# Patient Record
Sex: Female | Born: 1940 | Race: White | Hispanic: No | Smoking: Former smoker
Health system: Southern US, Community
[De-identification: ages and names within clinical notes are randomized; demographics above are authoritative.]

## PROBLEM LIST (undated history)

## (undated) DIAGNOSIS — K5520 Angiodysplasia of colon without hemorrhage: Secondary | ICD-10-CM

## (undated) DIAGNOSIS — N3281 Overactive bladder: Secondary | ICD-10-CM

## (undated) DIAGNOSIS — L719 Rosacea, unspecified: Secondary | ICD-10-CM

## (undated) DIAGNOSIS — M810 Age-related osteoporosis without current pathological fracture: Secondary | ICD-10-CM

## (undated) DIAGNOSIS — E785 Hyperlipidemia, unspecified: Secondary | ICD-10-CM

## (undated) DIAGNOSIS — I10 Essential (primary) hypertension: Secondary | ICD-10-CM

## (undated) DIAGNOSIS — S129XXA Fracture of neck, unspecified, initial encounter: Secondary | ICD-10-CM

## (undated) DIAGNOSIS — F1010 Alcohol abuse, uncomplicated: Secondary | ICD-10-CM

## (undated) DIAGNOSIS — K5790 Diverticulosis of intestine, part unspecified, without perforation or abscess without bleeding: Secondary | ICD-10-CM

## (undated) DIAGNOSIS — Z86010 Personal history of colonic polyps: Secondary | ICD-10-CM

## (undated) HISTORY — PX: APPENDECTOMY: SHX54

## (undated) HISTORY — PX: OTHER SURGICAL HISTORY: SHX169

## (undated) HISTORY — DX: Angiodysplasia of colon without hemorrhage: K55.20

## (undated) HISTORY — DX: Essential (primary) hypertension: I10

## (undated) HISTORY — DX: Rosacea, unspecified: L71.9

## (undated) HISTORY — DX: Personal history of colonic polyps: Z86.010

## (undated) HISTORY — DX: Age-related osteoporosis without current pathological fracture: M81.0

## (undated) HISTORY — DX: Diverticulosis of intestine, part unspecified, without perforation or abscess without bleeding: K57.90

## (undated) HISTORY — DX: Alcohol abuse, uncomplicated: F10.10

## (undated) HISTORY — DX: Overactive bladder: N32.81

## (undated) HISTORY — DX: Fracture of neck, unspecified, initial encounter: S12.9XXA

## (undated) HISTORY — DX: Hyperlipidemia, unspecified: E78.5

---
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
## 1991-02-19 DIAGNOSIS — S129XXA Fracture of neck, unspecified, initial encounter: Secondary | ICD-10-CM

## 1991-02-19 HISTORY — DX: Fracture of neck, unspecified, initial encounter: S12.9XXA

## 2006-07-17 DIAGNOSIS — K5790 Diverticulosis of intestine, part unspecified, without perforation or abscess without bleeding: Secondary | ICD-10-CM

## 2006-07-17 HISTORY — DX: Diverticulosis of intestine, part unspecified, without perforation or abscess without bleeding: K57.90

## 2006-07-17 HISTORY — PX: COLONOSCOPY: SHX174

## 2007-06-27 ENCOUNTER — Encounter: Admission: RE | Admit: 2007-06-27 | Discharge: 2007-06-27 | Payer: Self-pay | Admitting: Family Medicine

## 2009-01-16 ENCOUNTER — Encounter: Admission: RE | Admit: 2009-01-16 | Discharge: 2009-01-16 | Payer: Self-pay | Admitting: Orthopedic Surgery

## 2010-03-11 ENCOUNTER — Encounter: Payer: Self-pay | Admitting: Orthopedic Surgery

## 2011-10-23 DIAGNOSIS — M810 Age-related osteoporosis without current pathological fracture: Secondary | ICD-10-CM | POA: Insufficient documentation

## 2011-10-23 DIAGNOSIS — N3281 Overactive bladder: Secondary | ICD-10-CM | POA: Insufficient documentation

## 2011-10-23 DIAGNOSIS — E785 Hyperlipidemia, unspecified: Secondary | ICD-10-CM | POA: Insufficient documentation

## 2013-03-25 ENCOUNTER — Encounter: Payer: Self-pay | Admitting: Internal Medicine

## 2013-03-26 ENCOUNTER — Ambulatory Visit (INDEPENDENT_AMBULATORY_CARE_PROVIDER_SITE_OTHER): Payer: Medicare Other | Admitting: Internal Medicine

## 2013-03-26 ENCOUNTER — Encounter: Payer: Self-pay | Admitting: Internal Medicine

## 2013-03-26 VITALS — BP 112/66 | HR 87 | Ht 67.0 in | Wt 153.0 lb

## 2013-03-26 DIAGNOSIS — R195 Other fecal abnormalities: Secondary | ICD-10-CM

## 2013-03-26 MED ORDER — NA SULFATE-K SULFATE-MG SULF 17.5-3.13-1.6 GM/177ML PO SOLN: ORAL | Status: DC
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
## 2013-03-26 NOTE — Patient Instructions (Signed)

## 2013-03-30 ENCOUNTER — Encounter: Payer: Self-pay | Admitting: Internal Medicine

## 2013-04-30 ENCOUNTER — Encounter: Payer: Self-pay | Admitting: Internal Medicine

## 2013-04-30 ENCOUNTER — Ambulatory Visit (AMBULATORY_SURGERY_CENTER): Payer: Medicare Other | Admitting: Internal Medicine

## 2013-04-30 VITALS — BP 148/70 | HR 55 | Temp 96.8°F | Resp 14 | Ht 67.0 in | Wt 153.0 lb

## 2013-04-30 DIAGNOSIS — R195 Other fecal abnormalities: Secondary | ICD-10-CM

## 2013-04-30 DIAGNOSIS — D126 Benign neoplasm of colon, unspecified: Secondary | ICD-10-CM

## 2013-04-30 DIAGNOSIS — K5520 Angiodysplasia of colon without hemorrhage: Secondary | ICD-10-CM

## 2013-04-30 DIAGNOSIS — K648 Other hemorrhoids: Secondary | ICD-10-CM | POA: Insufficient documentation

## 2013-04-30 HISTORY — DX: Angiodysplasia of colon without hemorrhage: K55.20

## 2013-04-30 MED ORDER — SODIUM CHLORIDE 0.9 % IV SOLN: 500.0000 mL | INTRAVENOUS | Status: DC

## 2013-04-30 NOTE — Progress Notes (Signed)
  [facility] Post-op Note  Patient: Karen Blake  Procedure(s) Performed: colonoscopy  Patient Location: [facility] - Recovery Area  Anesthesia Type: Deep Sedation/Propofol  Level of Consciousness: awake, oriented and patient cooperative  Airway and Oxygen Therapy: Patient Spontanous Breathing  Post-op Pain: none  Post-op Assessment:  Post-op Vital signs reviewed, Patient's Cardiovascular Status Stable, Respiratory Function Stable, Patent Airway, No signs of Nausea or vomiting and Pain level controlled  Post-op Vital Signs: Reviewed and stable  Complications: No apparent anesthesia complications  Creedence Kunesh E 3:44 PM

## 2013-04-30 NOTE — Op Note (Signed)
[place]  [address] [place], [id number]   COLONOSCOPY PROCEDURE REPORT  PATIENT: Karen Blake, Karen Blake  MR#: [id number] BIRTHDATE: 17-Aug-1940 , 72  yrs. old GENDER: Female ENDOSCOPIST: Gatha Mayer, MD, [facility] PROCEDURE DATE:  04/30/2013 PROCEDURE:   Colonoscopy with biopsy and snare polypectomy First Screening Colonoscopy - Avg.  risk and is 50 yrs.  old or older - No.  Prior Negative Screening - Now for repeat screening. N/A  History of Adenoma - Now for follow-up colonoscopy & has been > or = to 3 yrs.  N/A  Polyps Removed Today? Yes. ASA CLASS:   Class II INDICATIONS:heme-positive stool. MEDICATIONS: propofol (Diprivan) 200mg  IV, MAC sedation, administered by CRNA, and These medications were titrated to patient response per physician's verbal order  DESCRIPTION OF PROCEDURE:   After the risks benefits and alternatives of the procedure were thoroughly explained, informed consent was obtained.  A digital rectal exam revealed no abnormalities of the rectum.   The LB [id number]  endoscope was introduced through the anus and advanced to the cecum, which was identified by both the appendix and ileocecal valve. No adverse events experienced.   The quality of the prep was excellent using Suprep  The instrument was then slowly withdrawn as the colon was fully examined.  COLON FINDINGS: Two sessile polyps measuring 2 and 4 mm in size were found at the hepatic flexure and in the sigmoid colon.  A polypectomy was performed with cold forceps (32mm) and with a cold snare (40mm).  The resection was complete and the polyp tissue was completely retrieved.   Small angiodysplastic lesion was found at the cecum.   The colon mucosa was otherwise normal.   A right colon retroflexion was performed.  Retroflexed views revealed internal hemorrhoids. The time to cecum=4 minutes 32 seconds.  Withdrawal time=12 minutes 43 seconds.  The scope was withdrawn and the procedure  completed. COMPLICATIONS: There were no complications.  ENDOSCOPIC IMPRESSION: 1.   Two sessile polyps measuring 2 and 4 mm in size were found at the hepatic flexure and in the sigmoid colon; polypectomy was performed with cold forceps and with a cold snare 2.   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   The colon mucosa was otherwise normal 4.   Internal hemorrhoids  RECOMMENDATIONS: 1.  Timing of repeat colonoscopy will be determined by pathology findings. 2.   Try to avoid routine hemoccults going forward - likely to get false + 3.   monitor Hgb regularly if ever on chronic anti-platelet/anti-coagulants given the angiodysplasia  eSigned:  Gatha Mayer, MD, [facility] 04/30/2013 3:48 PM  cc: Janalyn Rouse, MD and The Patient

## 2013-04-30 NOTE — Patient Instructions (Addendum)
I found and removed two tiny polyps that look benign.  You also have angiodysplasia of the cecum - an arteriovenous malformation on the wall of the colon. Internal hemorrhoids were also seen. Either or both of these were the cause of the blood in the stool.  I will let you know pathology results and when to have another routine colonoscopy by mail.  I appreciate the opportunity to care for you. Gatha Mayer, MD, FACG  YOU HAD AN ENDOSCOPIC PROCEDURE TODAY AT [facility]: Refer to the procedure report that was given to you for any specific questions about what was found during the examination.  If the procedure report does not answer your questions, please call your gastroenterologist to clarify.  If you requested that your care partner not be given the details of your procedure findings, then the procedure report has been included in a sealed envelope for you to review at your convenience later.  YOU SHOULD EXPECT: Some feelings of bloating in the abdomen. Passage of more gas than usual.  Walking can help get rid of the air that was put into your GI tract during the procedure and reduce the bloating. If you had a lower endoscopy (such as a colonoscopy or flexible sigmoidoscopy) you may notice spotting of blood in your stool or on the toilet paper. If you underwent a bowel prep for your procedure, then you may not have a normal bowel movement for a few days.  DIET: Your first meal following the procedure should be a light meal and then it is ok to progress to your normal diet.  A half-sandwich or bowl of soup is an example of a good first meal.  Heavy or fried foods are harder to digest and may make you feel nauseous or bloated.  Likewise meals heavy in dairy and vegetables can cause extra gas to form and this can also increase the bloating.  Drink plenty of fluids but you should avoid alcoholic beverages for 24 hours.  ACTIVITY: Your care partner should take you home directly  after the procedure.  You should plan to take it easy, moving slowly for the rest of the day.  You can resume normal activity the day after the procedure however you should NOT DRIVE or use heavy machinery for 24 hours (because of the sedation medicines used during the test).    SYMPTOMS TO REPORT IMMEDIATELY: A gastroenterologist can be reached at any hour.  During normal business hours, 8:30 AM to 5:00 PM Monday through Friday, call [phone].  After hours and on weekends, please call the GI answering service at [phone] who will take a message and have the physician on call contact you.   Following lower endoscopy (colonoscopy or flexible sigmoidoscopy):  Excessive amounts of blood in the stool  Significant tenderness or worsening of abdominal pains  Swelling of the abdomen that is new, acute  Fever of 100F or higher  FOLLOW UP: If any biopsies were taken you will be contacted by phone or by letter within the next 1-3 weeks.  Call your gastroenterologist if you have not heard about the biopsies in 3 weeks.  Our staff will call the home number listed on your records the next business day following your procedure to check on you and address any questions or concerns that you may have at that time regarding the information given to you following your procedure. This is a courtesy call and so if there is no answer at the  home number and we have not heard from you through the emergency physician on call, we will assume that you have returned to your regular daily activities without incident.  SIGNATURES/CONFIDENTIALITY: You and/or your care partner have signed paperwork which will be entered into your electronic medical record.  These signatures attest to the fact that that the information above on your After Visit Summary has been reviewed and is understood.  Full responsibility of the confidentiality of this discharge information lies with you and/or your care-partner.

## 2013-04-30 NOTE — Progress Notes (Signed)
Called to room to assist during endoscopic procedure.  Patient ID and intended procedure confirmed with present staff. Received instructions for my participation in the procedure from the performing physician.  

## 2013-05-03 ENCOUNTER — Telehealth: Payer: Self-pay | Admitting: *Deleted

## 2013-05-03 NOTE — Telephone Encounter (Signed)
  Follow up Call-  Call back number 04/30/2013  Post procedure Call Back phone  # [phone]  Permission to leave phone message Yes     Patient questions:  Do you have a fever, pain , or abdominal swelling? no Pain Score  0 *  Have you tolerated food without any problems? yes  Have you been able to return to your normal activities? yes  Do you have any questions about your discharge instructions: Diet   no Medications  no Follow up visit  no  Do you have questions or concerns about your Care? no  Actions: * If pain score is 4 or above: No action needed, pain <4.

## 2013-05-04 ENCOUNTER — Encounter: Payer: Self-pay | Admitting: Internal Medicine

## 2013-05-04 DIAGNOSIS — Z860100 Personal history of colon polyps, unspecified: Secondary | ICD-10-CM

## 2013-05-04 HISTORY — DX: Personal history of colonic polyps: Z86.010

## 2013-05-04 HISTORY — DX: Personal history of colon polyps, unspecified: Z86.0100

## 2013-05-04 NOTE — Progress Notes (Signed)
Quick Note:  2 diminutive tubular adenomas Repeat colonoscopy 5 years (probable) - will be 33 - 2020 ______

## 2013-09-03 ENCOUNTER — Ambulatory Visit (INDEPENDENT_AMBULATORY_CARE_PROVIDER_SITE_OTHER): Payer: MEDICARE | Admitting: Emergency Medicine

## 2013-09-03 VITALS — BP 128/64 | HR 76 | Temp 98.2°F | Resp 16 | Ht 67.0 in | Wt 154.0 lb

## 2013-09-03 DIAGNOSIS — H6120 Impacted cerumen, unspecified ear: Secondary | ICD-10-CM

## 2013-09-03 DIAGNOSIS — H6122 Impacted cerumen, left ear: Secondary | ICD-10-CM

## 2013-09-03 NOTE — Patient Instructions (Signed)
Cerumen Impaction °A cerumen impaction is when the wax in your ear forms a plug. This plug usually causes reduced hearing. Sometimes it also causes an earache or dizziness. Removing a cerumen impaction can be difficult and painful. The wax sticks to the ear canal. The canal is sensitive and bleeds easily. If you try to remove a heavy wax buildup with a cotton tipped swab, you may push it in further. °Irrigation with water, suction, and small ear curettes may be used to clear out the wax. If the impaction is fixed to the skin in the ear canal, ear drops may be needed for a few days to loosen the wax. People who build up a lot of wax frequently can use ear wax removal products available in your local drugstore. °SEEK MEDICAL CARE IF:  °You develop an earache, increased hearing loss, or marked dizziness. °Document Released: 03/14/2004 Document Revised: 04/29/2011 Document Reviewed: 05/04/2009 °ExitCare® Patient Information ©2015 [employer]. This information is not intended to replace advice given to you by your health care provider. Make sure you discuss any questions you have with your health care provider. ° °
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
## 2014-10-19 ENCOUNTER — Other Ambulatory Visit: Payer: Self-pay | Admitting: Internal Medicine

## 2014-10-19 DIAGNOSIS — M79605 Pain in left leg: Secondary | ICD-10-CM

## 2014-10-20 ENCOUNTER — Other Ambulatory Visit: Payer: Self-pay

## 2014-10-20 ENCOUNTER — Ambulatory Visit
Admission: RE | Admit: 2014-10-20 | Discharge: 2014-10-20 | Disposition: A | Discharge status: Home / Self Care | Payer: PPO | Source: Ambulatory Visit | Attending: Internal Medicine | Admitting: Internal Medicine

## 2014-10-20 DIAGNOSIS — M79605 Pain in left leg: Secondary | ICD-10-CM

## 2015-03-01 DIAGNOSIS — M810 Age-related osteoporosis without current pathological fracture: Secondary | ICD-10-CM | POA: Diagnosis not present

## 2015-03-01 DIAGNOSIS — E784 Other hyperlipidemia: Secondary | ICD-10-CM | POA: Diagnosis not present

## 2015-03-01 DIAGNOSIS — R030 Elevated blood-pressure reading, without diagnosis of hypertension: Secondary | ICD-10-CM | POA: Diagnosis not present

## 2015-03-01 DIAGNOSIS — N390 Urinary tract infection, site not specified: Secondary | ICD-10-CM | POA: Diagnosis not present

## 2015-03-08 DIAGNOSIS — R600 Localized edema: Secondary | ICD-10-CM | POA: Diagnosis not present

## 2015-03-08 DIAGNOSIS — N3281 Overactive bladder: Secondary | ICD-10-CM | POA: Diagnosis not present

## 2015-03-08 DIAGNOSIS — E784 Other hyperlipidemia: Secondary | ICD-10-CM | POA: Diagnosis not present

## 2015-03-08 DIAGNOSIS — R030 Elevated blood-pressure reading, without diagnosis of hypertension: Secondary | ICD-10-CM | POA: Diagnosis not present

## 2015-03-08 DIAGNOSIS — Z1389 Encounter for screening for other disorder: Secondary | ICD-10-CM | POA: Diagnosis not present

## 2015-03-08 DIAGNOSIS — H9190 Unspecified hearing loss, unspecified ear: Secondary | ICD-10-CM | POA: Diagnosis not present

## 2015-03-08 DIAGNOSIS — Z6824 Body mass index (BMI) 24.0-24.9, adult: Secondary | ICD-10-CM | POA: Diagnosis not present

## 2015-03-08 DIAGNOSIS — M79632 Pain in left forearm: Secondary | ICD-10-CM | POA: Diagnosis not present

## 2015-03-08 DIAGNOSIS — M810 Age-related osteoporosis without current pathological fracture: Secondary | ICD-10-CM | POA: Diagnosis not present

## 2015-03-08 DIAGNOSIS — Z0000 Encounter for general adult medical examination without abnormal findings: Secondary | ICD-10-CM | POA: Diagnosis not present

## 2015-03-30 DIAGNOSIS — Z124 Encounter for screening for malignant neoplasm of cervix: Secondary | ICD-10-CM | POA: Diagnosis not present

## 2015-10-25 DIAGNOSIS — J209 Acute bronchitis, unspecified: Secondary | ICD-10-CM | POA: Diagnosis not present

## 2015-10-25 DIAGNOSIS — Z6824 Body mass index (BMI) 24.0-24.9, adult: Secondary | ICD-10-CM | POA: Diagnosis not present

## 2015-10-25 DIAGNOSIS — R05 Cough: Secondary | ICD-10-CM | POA: Diagnosis not present

## 2015-10-25 DIAGNOSIS — R5383 Other fatigue: Secondary | ICD-10-CM | POA: Diagnosis not present

## 2015-11-03 DIAGNOSIS — Z23 Encounter for immunization: Secondary | ICD-10-CM | POA: Diagnosis not present

## 2015-11-07 DIAGNOSIS — S83242A Other tear of medial meniscus, current injury, left knee, initial encounter: Secondary | ICD-10-CM | POA: Diagnosis not present

## 2015-11-16 DIAGNOSIS — H35033 Hypertensive retinopathy, bilateral: Secondary | ICD-10-CM | POA: Diagnosis not present

## 2015-11-21 DIAGNOSIS — Z1231 Encounter for screening mammogram for malignant neoplasm of breast: Secondary | ICD-10-CM | POA: Diagnosis not present

## 2015-12-07 DIAGNOSIS — S83242D Other tear of medial meniscus, current injury, left knee, subsequent encounter: Secondary | ICD-10-CM | POA: Diagnosis not present

## 2015-12-15 DIAGNOSIS — Z87891 Personal history of nicotine dependence: Secondary | ICD-10-CM | POA: Diagnosis not present

## 2015-12-15 DIAGNOSIS — H6123 Impacted cerumen, bilateral: Secondary | ICD-10-CM | POA: Diagnosis not present

## 2015-12-15 DIAGNOSIS — H903 Sensorineural hearing loss, bilateral: Secondary | ICD-10-CM | POA: Diagnosis not present

## 2015-12-15 DIAGNOSIS — Z974 Presence of external hearing-aid: Secondary | ICD-10-CM | POA: Diagnosis not present

## 2016-03-05 DIAGNOSIS — E784 Other hyperlipidemia: Secondary | ICD-10-CM | POA: Diagnosis not present

## 2016-03-05 DIAGNOSIS — R8299 Other abnormal findings in urine: Secondary | ICD-10-CM | POA: Diagnosis not present

## 2016-03-05 DIAGNOSIS — R030 Elevated blood-pressure reading, without diagnosis of hypertension: Secondary | ICD-10-CM | POA: Diagnosis not present

## 2016-03-05 DIAGNOSIS — M810 Age-related osteoporosis without current pathological fracture: Secondary | ICD-10-CM | POA: Diagnosis not present

## 2016-03-13 DIAGNOSIS — Z6824 Body mass index (BMI) 24.0-24.9, adult: Secondary | ICD-10-CM | POA: Diagnosis not present

## 2016-03-13 DIAGNOSIS — M25562 Pain in left knee: Secondary | ICD-10-CM | POA: Diagnosis not present

## 2016-03-13 DIAGNOSIS — Z1389 Encounter for screening for other disorder: Secondary | ICD-10-CM | POA: Diagnosis not present

## 2016-03-13 DIAGNOSIS — R030 Elevated blood-pressure reading, without diagnosis of hypertension: Secondary | ICD-10-CM | POA: Diagnosis not present

## 2016-03-13 DIAGNOSIS — E784 Other hyperlipidemia: Secondary | ICD-10-CM | POA: Diagnosis not present

## 2016-03-13 DIAGNOSIS — Z86010 Personal history of colonic polyps: Secondary | ICD-10-CM | POA: Diagnosis not present

## 2016-03-13 DIAGNOSIS — Z0000 Encounter for general adult medical examination without abnormal findings: Secondary | ICD-10-CM | POA: Diagnosis not present

## 2016-03-13 DIAGNOSIS — M810 Age-related osteoporosis without current pathological fracture: Secondary | ICD-10-CM | POA: Diagnosis not present

## 2016-05-02 DIAGNOSIS — Z124 Encounter for screening for malignant neoplasm of cervix: Secondary | ICD-10-CM | POA: Diagnosis not present

## 2016-05-02 DIAGNOSIS — L900 Lichen sclerosus et atrophicus: Secondary | ICD-10-CM | POA: Insufficient documentation

## 2016-06-27 DIAGNOSIS — M810 Age-related osteoporosis without current pathological fracture: Secondary | ICD-10-CM | POA: Diagnosis not present

## 2016-08-08 DIAGNOSIS — L0889 Other specified local infections of the skin and subcutaneous tissue: Secondary | ICD-10-CM | POA: Diagnosis not present

## 2016-08-08 DIAGNOSIS — W5501XA Bitten by cat, initial encounter: Secondary | ICD-10-CM | POA: Diagnosis not present

## 2016-08-08 DIAGNOSIS — Z6824 Body mass index (BMI) 24.0-24.9, adult: Secondary | ICD-10-CM | POA: Diagnosis not present

## 2016-08-08 DIAGNOSIS — S61256A Open bite of right little finger without damage to nail, initial encounter: Secondary | ICD-10-CM | POA: Diagnosis not present

## 2016-08-09 DIAGNOSIS — L089 Local infection of the skin and subcutaneous tissue, unspecified: Secondary | ICD-10-CM | POA: Diagnosis not present

## 2016-08-09 DIAGNOSIS — S61452D Open bite of left hand, subsequent encounter: Secondary | ICD-10-CM | POA: Diagnosis not present

## 2016-08-12 DIAGNOSIS — W5501XD Bitten by cat, subsequent encounter: Secondary | ICD-10-CM | POA: Diagnosis not present

## 2016-08-12 DIAGNOSIS — L089 Local infection of the skin and subcutaneous tissue, unspecified: Secondary | ICD-10-CM | POA: Diagnosis not present

## 2016-08-12 DIAGNOSIS — S61452D Open bite of left hand, subsequent encounter: Secondary | ICD-10-CM | POA: Diagnosis not present

## 2016-08-22 DIAGNOSIS — W5501XD Bitten by cat, subsequent encounter: Secondary | ICD-10-CM | POA: Diagnosis not present

## 2016-08-22 DIAGNOSIS — S61452D Open bite of left hand, subsequent encounter: Secondary | ICD-10-CM | POA: Diagnosis not present

## 2016-08-22 DIAGNOSIS — S61451D Open bite of right hand, subsequent encounter: Secondary | ICD-10-CM | POA: Diagnosis not present

## 2016-08-22 DIAGNOSIS — L089 Local infection of the skin and subcutaneous tissue, unspecified: Secondary | ICD-10-CM | POA: Diagnosis not present

## 2016-11-16 DIAGNOSIS — Z23 Encounter for immunization: Secondary | ICD-10-CM | POA: Diagnosis not present

## 2016-11-21 DIAGNOSIS — H2513 Age-related nuclear cataract, bilateral: Secondary | ICD-10-CM | POA: Diagnosis not present

## 2016-11-21 DIAGNOSIS — H35033 Hypertensive retinopathy, bilateral: Secondary | ICD-10-CM | POA: Diagnosis not present

## 2016-11-21 DIAGNOSIS — H5213 Myopia, bilateral: Secondary | ICD-10-CM | POA: Diagnosis not present

## 2016-12-04 DIAGNOSIS — H6123 Impacted cerumen, bilateral: Secondary | ICD-10-CM | POA: Diagnosis not present

## 2016-12-04 DIAGNOSIS — H93293 Other abnormal auditory perceptions, bilateral: Secondary | ICD-10-CM | POA: Diagnosis not present

## 2016-12-04 DIAGNOSIS — Z87891 Personal history of nicotine dependence: Secondary | ICD-10-CM | POA: Diagnosis not present

## 2016-12-24 DIAGNOSIS — H903 Sensorineural hearing loss, bilateral: Secondary | ICD-10-CM | POA: Diagnosis not present

## 2017-03-06 DIAGNOSIS — M810 Age-related osteoporosis without current pathological fracture: Secondary | ICD-10-CM | POA: Diagnosis not present

## 2017-03-06 DIAGNOSIS — R82998 Other abnormal findings in urine: Secondary | ICD-10-CM | POA: Diagnosis not present

## 2017-03-06 DIAGNOSIS — E7849 Other hyperlipidemia: Secondary | ICD-10-CM | POA: Diagnosis not present

## 2017-03-14 DIAGNOSIS — Z6825 Body mass index (BMI) 25.0-25.9, adult: Secondary | ICD-10-CM | POA: Diagnosis not present

## 2017-03-14 DIAGNOSIS — M67432 Ganglion, left wrist: Secondary | ICD-10-CM | POA: Diagnosis not present

## 2017-03-14 DIAGNOSIS — E7849 Other hyperlipidemia: Secondary | ICD-10-CM | POA: Diagnosis not present

## 2017-03-14 DIAGNOSIS — R030 Elevated blood-pressure reading, without diagnosis of hypertension: Secondary | ICD-10-CM | POA: Diagnosis not present

## 2017-03-14 DIAGNOSIS — Z0000 Encounter for general adult medical examination without abnormal findings: Secondary | ICD-10-CM | POA: Diagnosis not present

## 2017-03-14 DIAGNOSIS — Z86010 Personal history of colonic polyps: Secondary | ICD-10-CM | POA: Diagnosis not present

## 2017-03-14 DIAGNOSIS — M810 Age-related osteoporosis without current pathological fracture: Secondary | ICD-10-CM | POA: Diagnosis not present

## 2017-03-14 DIAGNOSIS — N3281 Overactive bladder: Secondary | ICD-10-CM | POA: Diagnosis not present

## 2017-03-14 DIAGNOSIS — Z1389 Encounter for screening for other disorder: Secondary | ICD-10-CM | POA: Diagnosis not present

## 2017-04-17 DIAGNOSIS — D1722 Benign lipomatous neoplasm of skin and subcutaneous tissue of left arm: Secondary | ICD-10-CM | POA: Diagnosis not present

## 2017-04-30 ENCOUNTER — Other Ambulatory Visit (HOSPITAL_COMMUNITY)
Admission: RE | Admit: 2017-04-30 | Discharge: 2017-04-30 | Disposition: A | Discharge status: Home / Self Care | Payer: PPO | Source: Ambulatory Visit | Attending: Obstetrics and Gynecology | Admitting: Obstetrics and Gynecology

## 2017-04-30 ENCOUNTER — Ambulatory Visit (INDEPENDENT_AMBULATORY_CARE_PROVIDER_SITE_OTHER): Payer: PPO | Admitting: Obstetrics and Gynecology

## 2017-04-30 ENCOUNTER — Encounter: Payer: Self-pay | Admitting: Obstetrics and Gynecology

## 2017-04-30 ENCOUNTER — Other Ambulatory Visit: Payer: Self-pay

## 2017-04-30 VITALS — BP 144/66 | HR 72 | Resp 14 | Ht 66.5 in | Wt 160.4 lb

## 2017-04-30 DIAGNOSIS — L900 Lichen sclerosus et atrophicus: Secondary | ICD-10-CM

## 2017-04-30 DIAGNOSIS — Z01419 Encounter for gynecological examination (general) (routine) without abnormal findings: Secondary | ICD-10-CM | POA: Diagnosis not present

## 2017-04-30 DIAGNOSIS — Z124 Encounter for screening for malignant neoplasm of cervix: Secondary | ICD-10-CM | POA: Insufficient documentation

## 2017-04-30 NOTE — Addendum Note (Signed)
Addended by: Dorothy Spark on: 04/30/2017 04:59 PM   Modules accepted: Orders

## 2017-04-30 NOTE — Patient Instructions (Signed)
EXERCISE AND DIET:  We recommended that you start or continue a regular exercise program for good health. Regular exercise means any activity that makes your heart beat faster and makes you sweat.  We recommend exercising at least 30 minutes per day at least 3 days a week, preferably 4 or 5.  We also recommend a diet low in fat and sugar.  Inactivity, poor dietary choices and obesity can cause diabetes, heart attack, stroke, and kidney damage, among others.    ALCOHOL AND SMOKING:  Women should limit their alcohol intake to no more than 7 drinks/beers/glasses of wine (combined, not each!) per week. Moderation of alcohol intake to this level decreases your risk of breast cancer and liver damage. And of course, no recreational drugs are part of a healthy lifestyle.  And absolutely no smoking or even second hand smoke. Most people know smoking can cause heart and lung diseases, but did you know it also contributes to weakening of your bones? Aging of your skin?  Yellowing of your teeth and nails?  CALCIUM AND VITAMIN D:  Adequate intake of calcium and Vitamin D are recommended.  The recommendations for exact amounts of these supplements seem to change often, but generally speaking 600 mg of calcium (either carbonate or citrate) and 800 units of Vitamin D per day seems prudent. Certain women may benefit from higher intake of Vitamin D.  If you are among these women, your doctor will have told you during your visit.    PAP SMEARS:  Pap smears, to check for cervical cancer or precancers,  have traditionally been done yearly, although recent scientific advances have shown that most women can have pap smears less often.  However, every woman still should have a physical exam from her gynecologist every year. It will include a breast check, inspection of the vulva and vagina to check for abnormal growths or skin changes, a visual exam of the cervix, and then an exam to evaluate the size and shape of the uterus and  ovaries.  And after 77 years of age, a rectal exam is indicated to check for rectal cancers. We will also provide age appropriate advice regarding health maintenance, like when you should have certain vaccines, screening for sexually transmitted diseases, bone density testing, colonoscopy, mammograms, etc.   MAMMOGRAMS:  All women over 40 years old should have a yearly mammogram. Many facilities now offer a "3D" mammogram, which may cost around $50 extra out of pocket. If possible,  we recommend you accept the option to have the 3D mammogram performed.  It both reduces the number of women who will be called back for extra views which then turn out to be normal, and it is better than the routine mammogram at detecting truly abnormal areas.    COLONOSCOPY:  Colonoscopy to screen for colon cancer is recommended for all women at age 50.  We know, you hate the idea of the prep.  We agree, BUT, having colon cancer and not knowing it is worse!!  Colon cancer so often starts as a polyp that can be seen and removed at colonscopy, which can quite literally save your life!  And if your first colonoscopy is normal and you have no family history of colon cancer, most women don't have to have it again for 10 years.  Once every ten years, you can do something that may end up saving your life, right?  We will be happy to help you get it scheduled when you are ready.    Be sure to check your insurance coverage so you understand how much it will cost.  It may be covered as a preventative service at no cost, but you should check your particular policy.      Breast Self-Awareness Breast self-awareness means being familiar with how your breasts look and feel. It involves checking your breasts regularly and reporting any changes to your health care provider. Practicing breast self-awareness is important. A change in your breasts can be a sign of a serious medical problem. Being familiar with how your breasts look and feel allows  you to find any problems early, when treatment is more likely to be successful. All women should practice breast self-awareness, including women who have had breast implants. How to do a breast self-exam One way to learn what is normal for your breasts and whether your breasts are changing is to do a breast self-exam. To do a breast self-exam: Look for Changes  1. Remove all the clothing above your waist. 2. Stand in front of a mirror in a room with good lighting. 3. Put your hands on your hips. 4. Push your hands firmly downward. 5. Compare your breasts in the mirror. Look for differences between them (asymmetry), such as: ? Differences in shape. ? Differences in size. ? Puckers, dips, and bumps in one breast and not the other. 6. Look at each breast for changes in your skin, such as: ? Redness. ? Scaly areas. 7. Look for changes in your nipples, such as: ? Discharge. ? Bleeding. ? Dimpling. ? Redness. ? A change in position. Feel for Changes  Carefully feel your breasts for lumps and changes. It is best to do this while lying on your back on the floor and again while sitting or standing in the shower or tub with soapy water on your skin. Feel each breast in the following way:  Place the arm on the side of the breast you are examining above your head.  Feel your breast with the other hand.  Start in the nipple area and make  inch (2 cm) overlapping circles to feel your breast. Use the pads of your three middle fingers to do this. Apply light pressure, then medium pressure, then firm pressure. The light pressure will allow you to feel the tissue closest to the skin. The medium pressure will allow you to feel the tissue that is a little deeper. The firm pressure will allow you to feel the tissue close to the ribs.  Continue the overlapping circles, moving downward over the breast until you feel your ribs below your breast.  Move one finger-width toward the center of the body.  Continue to use the  inch (2 cm) overlapping circles to feel your breast as you move slowly up toward your collarbone.  Continue the up and down exam using all three pressures until you reach your armpit.  Write Down What You Find  Write down what is normal for each breast and any changes that you find. Keep a written record with breast changes or normal findings for each breast. By writing this information down, you do not need to depend only on memory for size, tenderness, or location. Write down where you are in your menstrual cycle, if you are still menstruating. If you are having trouble noticing differences in your breasts, do not get discouraged. With time you will become more familiar with the variations in your breasts and more comfortable with the exam. How often should I examine my breasts? Examine   your breasts every month. If you are breastfeeding, the best time to examine your breasts is after a feeding or after using a breast pump. If you menstruate, the best time to examine your breasts is 5-7 days after your period is over. During your period, your breasts are lumpier, and it may be more difficult to notice changes. When should I see my health care provider? See your health care provider if you notice:  A change in shape or size of your breasts or nipples.  A change in the skin of your breast or nipples, such as a reddened or scaly area.  Unusual discharge from your nipples.  A lump or thick area that was not there before.  Pain in your breasts.  Anything that concerns you.  This information is not intended to replace advice given to you by your health care provider. Make sure you discuss any questions you have with your health care provider. Document Released: 02/04/2005 Document Revised: 07/13/2015 Document Reviewed: 12/25/2014 Elsevier Interactive Patient Education  2018 [employer]  
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
## 2017-05-02 LAB — CYTOLOGY - PAP: DIAGNOSIS: NEGATIVE

## 2017-05-12 DIAGNOSIS — J209 Acute bronchitis, unspecified: Secondary | ICD-10-CM | POA: Diagnosis not present

## 2017-05-12 DIAGNOSIS — R0602 Shortness of breath: Secondary | ICD-10-CM | POA: Diagnosis not present

## 2017-05-12 DIAGNOSIS — Z6824 Body mass index (BMI) 24.0-24.9, adult: Secondary | ICD-10-CM | POA: Diagnosis not present

## 2017-05-12 DIAGNOSIS — R05 Cough: Secondary | ICD-10-CM | POA: Diagnosis not present

## 2017-07-16 ENCOUNTER — Other Ambulatory Visit: Payer: Self-pay

## 2017-07-16 DIAGNOSIS — D481 Neoplasm of uncertain behavior of connective and other soft tissue: Secondary | ICD-10-CM | POA: Diagnosis not present

## 2017-07-16 DIAGNOSIS — D1809 Hemangioma of other sites: Secondary | ICD-10-CM | POA: Diagnosis not present

## 2017-08-04 ENCOUNTER — Encounter (HOSPITAL_COMMUNITY): Payer: Self-pay | Admitting: Orthopedic Surgery

## 2017-11-01 DIAGNOSIS — Z23 Encounter for immunization: Secondary | ICD-10-CM | POA: Diagnosis not present

## 2017-12-01 DIAGNOSIS — L989 Disorder of the skin and subcutaneous tissue, unspecified: Secondary | ICD-10-CM | POA: Diagnosis not present

## 2017-12-01 DIAGNOSIS — Z6824 Body mass index (BMI) 24.0-24.9, adult: Secondary | ICD-10-CM | POA: Diagnosis not present

## 2017-12-04 DIAGNOSIS — C44722 Squamous cell carcinoma of skin of right lower limb, including hip: Secondary | ICD-10-CM | POA: Diagnosis not present

## 2017-12-26 DIAGNOSIS — Z85828 Personal history of other malignant neoplasm of skin: Secondary | ICD-10-CM | POA: Diagnosis not present

## 2017-12-26 DIAGNOSIS — C44722 Squamous cell carcinoma of skin of right lower limb, including hip: Secondary | ICD-10-CM | POA: Diagnosis not present

## 2018-02-02 ENCOUNTER — Other Ambulatory Visit: Payer: Self-pay | Admitting: Obstetrics and Gynecology

## 2018-02-02 DIAGNOSIS — Z1231 Encounter for screening mammogram for malignant neoplasm of breast: Secondary | ICD-10-CM

## 2018-03-09 ENCOUNTER — Ambulatory Visit
Admission: RE | Admit: 2018-03-09 | Discharge: 2018-03-09 | Disposition: A | Discharge status: Home / Self Care | Payer: PPO | Source: Ambulatory Visit | Attending: Obstetrics and Gynecology | Admitting: Obstetrics and Gynecology

## 2018-03-09 DIAGNOSIS — Z1231 Encounter for screening mammogram for malignant neoplasm of breast: Secondary | ICD-10-CM

## 2018-03-11 DIAGNOSIS — E7849 Other hyperlipidemia: Secondary | ICD-10-CM | POA: Diagnosis not present

## 2018-03-11 DIAGNOSIS — R82998 Other abnormal findings in urine: Secondary | ICD-10-CM | POA: Diagnosis not present

## 2018-03-11 DIAGNOSIS — M810 Age-related osteoporosis without current pathological fracture: Secondary | ICD-10-CM | POA: Diagnosis not present

## 2018-03-16 ENCOUNTER — Ambulatory Visit: Payer: PPO

## 2018-03-18 DIAGNOSIS — M810 Age-related osteoporosis without current pathological fracture: Secondary | ICD-10-CM | POA: Diagnosis not present

## 2018-03-18 DIAGNOSIS — Z1331 Encounter for screening for depression: Secondary | ICD-10-CM | POA: Diagnosis not present

## 2018-03-18 DIAGNOSIS — E7849 Other hyperlipidemia: Secondary | ICD-10-CM | POA: Diagnosis not present

## 2018-03-18 DIAGNOSIS — Z6824 Body mass index (BMI) 24.0-24.9, adult: Secondary | ICD-10-CM | POA: Diagnosis not present

## 2018-03-18 DIAGNOSIS — R4189 Other symptoms and signs involving cognitive functions and awareness: Secondary | ICD-10-CM | POA: Diagnosis not present

## 2018-03-18 DIAGNOSIS — Z859 Personal history of malignant neoplasm, unspecified: Secondary | ICD-10-CM | POA: Diagnosis not present

## 2018-03-18 DIAGNOSIS — Z0000 Encounter for general adult medical examination without abnormal findings: Secondary | ICD-10-CM | POA: Diagnosis not present

## 2018-03-18 DIAGNOSIS — N3281 Overactive bladder: Secondary | ICD-10-CM | POA: Diagnosis not present

## 2018-03-18 DIAGNOSIS — R030 Elevated blood-pressure reading, without diagnosis of hypertension: Secondary | ICD-10-CM | POA: Diagnosis not present

## 2018-03-18 DIAGNOSIS — M79602 Pain in left arm: Secondary | ICD-10-CM | POA: Diagnosis not present

## 2018-03-18 DIAGNOSIS — Z86010 Personal history of colonic polyps: Secondary | ICD-10-CM | POA: Diagnosis not present

## 2018-05-21 ENCOUNTER — Ambulatory Visit: Payer: PPO | Admitting: Obstetrics and Gynecology

## 2018-06-15 ENCOUNTER — Encounter: Payer: Self-pay | Admitting: Internal Medicine

## 2018-08-07 DIAGNOSIS — H10231 Serous conjunctivitis, except viral, right eye: Secondary | ICD-10-CM | POA: Diagnosis not present

## 2018-08-18 DIAGNOSIS — H10231 Serous conjunctivitis, except viral, right eye: Secondary | ICD-10-CM | POA: Diagnosis not present

## 2018-10-05 DIAGNOSIS — H04203 Unspecified epiphora, bilateral lacrimal glands: Secondary | ICD-10-CM | POA: Diagnosis not present

## 2018-10-05 DIAGNOSIS — H04551 Acquired stenosis of right nasolacrimal duct: Secondary | ICD-10-CM | POA: Diagnosis not present

## 2018-10-05 DIAGNOSIS — H1013 Acute atopic conjunctivitis, bilateral: Secondary | ICD-10-CM | POA: Diagnosis not present

## 2018-10-15 DIAGNOSIS — Z23 Encounter for immunization: Secondary | ICD-10-CM | POA: Diagnosis not present

## 2018-10-20 DIAGNOSIS — H1013 Acute atopic conjunctivitis, bilateral: Secondary | ICD-10-CM | POA: Diagnosis not present

## 2018-10-22 DIAGNOSIS — M65341 Trigger finger, right ring finger: Secondary | ICD-10-CM | POA: Diagnosis not present

## 2018-12-28 DIAGNOSIS — H9113 Presbycusis, bilateral: Secondary | ICD-10-CM | POA: Diagnosis not present

## 2018-12-28 DIAGNOSIS — Z87891 Personal history of nicotine dependence: Secondary | ICD-10-CM | POA: Diagnosis not present

## 2018-12-28 DIAGNOSIS — H6123 Impacted cerumen, bilateral: Secondary | ICD-10-CM | POA: Diagnosis not present

## 2019-03-17 ENCOUNTER — Ambulatory Visit: Payer: PPO

## 2019-03-17 DIAGNOSIS — R030 Elevated blood-pressure reading, without diagnosis of hypertension: Secondary | ICD-10-CM | POA: Diagnosis not present

## 2019-03-17 DIAGNOSIS — R82998 Other abnormal findings in urine: Secondary | ICD-10-CM | POA: Diagnosis not present

## 2019-03-17 DIAGNOSIS — E7849 Other hyperlipidemia: Secondary | ICD-10-CM | POA: Diagnosis not present

## 2019-03-17 DIAGNOSIS — Z0000 Encounter for general adult medical examination without abnormal findings: Secondary | ICD-10-CM | POA: Diagnosis not present

## 2019-03-17 DIAGNOSIS — M810 Age-related osteoporosis without current pathological fracture: Secondary | ICD-10-CM | POA: Diagnosis not present

## 2019-03-24 DIAGNOSIS — Z1331 Encounter for screening for depression: Secondary | ICD-10-CM | POA: Diagnosis not present

## 2019-03-24 DIAGNOSIS — M810 Age-related osteoporosis without current pathological fracture: Secondary | ICD-10-CM | POA: Diagnosis not present

## 2019-03-24 DIAGNOSIS — Z86010 Personal history of colonic polyps: Secondary | ICD-10-CM | POA: Diagnosis not present

## 2019-03-24 DIAGNOSIS — R030 Elevated blood-pressure reading, without diagnosis of hypertension: Secondary | ICD-10-CM | POA: Diagnosis not present

## 2019-03-24 DIAGNOSIS — Z0000 Encounter for general adult medical examination without abnormal findings: Secondary | ICD-10-CM | POA: Diagnosis not present

## 2019-03-24 DIAGNOSIS — E785 Hyperlipidemia, unspecified: Secondary | ICD-10-CM | POA: Diagnosis not present

## 2019-03-24 DIAGNOSIS — R4189 Other symptoms and signs involving cognitive functions and awareness: Secondary | ICD-10-CM | POA: Diagnosis not present

## 2019-03-26 ENCOUNTER — Ambulatory Visit: Payer: PPO

## 2019-04-13 ENCOUNTER — Other Ambulatory Visit: Payer: Self-pay | Admitting: Obstetrics and Gynecology

## 2019-04-13 DIAGNOSIS — Z1231 Encounter for screening mammogram for malignant neoplasm of breast: Secondary | ICD-10-CM

## 2019-05-24 DIAGNOSIS — H35031 Hypertensive retinopathy, right eye: Secondary | ICD-10-CM | POA: Diagnosis not present

## 2019-05-24 DIAGNOSIS — H2513 Age-related nuclear cataract, bilateral: Secondary | ICD-10-CM | POA: Diagnosis not present

## 2019-05-26 DIAGNOSIS — T161XXA Foreign body in right ear, initial encounter: Secondary | ICD-10-CM | POA: Diagnosis not present

## 2019-06-01 ENCOUNTER — Ambulatory Visit
Admission: RE | Admit: 2019-06-01 | Discharge: 2019-06-01 | Disposition: A | Discharge status: Home / Self Care | Payer: PPO | Source: Ambulatory Visit | Attending: Obstetrics and Gynecology | Admitting: Obstetrics and Gynecology

## 2019-06-01 ENCOUNTER — Other Ambulatory Visit: Payer: Self-pay

## 2019-06-01 DIAGNOSIS — Z1231 Encounter for screening mammogram for malignant neoplasm of breast: Secondary | ICD-10-CM

## 2019-06-07 ENCOUNTER — Ambulatory Visit (INDEPENDENT_AMBULATORY_CARE_PROVIDER_SITE_OTHER): Payer: PPO | Admitting: Obstetrics and Gynecology

## 2019-06-07 ENCOUNTER — Other Ambulatory Visit: Payer: Self-pay

## 2019-06-07 ENCOUNTER — Encounter: Payer: Self-pay | Admitting: Obstetrics and Gynecology

## 2019-06-07 VITALS — BP 156/60 | HR 72 | Temp 97.6°F | Resp 12 | Ht 66.75 in | Wt 154.1 lb

## 2019-06-07 DIAGNOSIS — Z01419 Encounter for gynecological examination (general) (routine) without abnormal findings: Secondary | ICD-10-CM | POA: Diagnosis not present

## 2019-06-07 DIAGNOSIS — L900 Lichen sclerosus et atrophicus: Secondary | ICD-10-CM

## 2019-06-07 MED ORDER — BETAMETHASONE VALERATE 0.1 % EX OINT: TOPICAL_OINTMENT | CUTANEOUS | 1 refills | Status: AC

## 2019-06-07 NOTE — Patient Instructions (Signed)
Kegel Exercises  Kegel exercises can help strengthen your pelvic floor muscles. The pelvic floor is a group of muscles that support your rectum, small intestine, and bladder. In females, pelvic floor muscles also help support the womb (uterus). These muscles help you control the flow of urine and stool. Kegel exercises are painless and simple, and they do not require any equipment. Your provider may suggest Kegel exercises to:  Improve bladder and bowel control.  Improve sexual response.  Improve weak pelvic floor muscles after surgery to remove the uterus (hysterectomy) or pregnancy (females).  Improve weak pelvic floor muscles after prostate gland removal or surgery (males). Kegel exercises involve squeezing your pelvic floor muscles, which are the same muscles you squeeze when you try to stop the flow of urine or keep from passing gas. The exercises can be done while sitting, standing, or lying down, but it is best to vary your position. Exercises How to do Kegel exercises: 1. Squeeze your pelvic floor muscles tight. You should feel a tight lift in your rectal area. If you are a female, you should also feel a tightness in your vaginal area. Keep your stomach, buttocks, and legs relaxed. 2. Hold the muscles tight for up to 10 seconds. 3. Breathe normally. 4. Relax your muscles. 5. Repeat as told by your health care provider. Repeat this exercise daily as told by your health care provider. Continue to do this exercise for at least 4-6 weeks, or for as long as told by your health care provider. You may be referred to a physical therapist who can help you learn more about how to do Kegel exercises. Depending on your condition, your health care provider may recommend:  Varying how long you squeeze your muscles.  Doing several sets of exercises every day.  Doing exercises for several weeks.  Making Kegel exercises a part of your regular exercise routine. This information is not intended  to replace advice given to you by your health care provider. Make sure you discuss any questions you have with your health care provider. Document Revised: 09/24/2017 Document Reviewed: 09/24/2017 Elsevier Patient Education  [place] DIET:  We recommended that you start or continue a regular exercise program for good health. Regular exercise means any activity that makes your heart beat faster and makes you sweat.  We recommend exercising at least 30 minutes per day at least 3 days a week, preferably 4 or 5.  We also recommend a diet low in fat and sugar.  Inactivity, poor dietary choices and obesity can cause diabetes, heart attack, stroke, and kidney damage, among others.    ALCOHOL AND SMOKING:  Women should limit their alcohol intake to no more than 7 drinks/beers/glasses of wine (combined, not each!) per week. Moderation of alcohol intake to this level decreases your risk of breast cancer and liver damage. And of course, no recreational drugs are part of a healthy lifestyle.  And absolutely no smoking or even second hand smoke. Most people know smoking can cause heart and lung diseases, but did you know it also contributes to weakening of your bones? Aging of your skin?  Yellowing of your teeth and nails?  CALCIUM AND VITAMIN D:  Adequate intake of calcium and Vitamin D are recommended.  The recommendations for exact amounts of these supplements seem to change often, but generally speaking 1,200 mg of calcium (between diet and supplement) and 800 units of Vitamin D per day seems prudent. Certain women may benefit  from higher intake of Vitamin D.  If you are among these women, your doctor will have told you during your visit.    PAP SMEARS:  Pap smears, to check for cervical cancer or precancers,  have traditionally been done yearly, although recent scientific advances have shown that most women can have pap smears less often.  However, every woman still should have a  physical exam from her gynecologist every year. It will include a breast check, inspection of the vulva and vagina to check for abnormal growths or skin changes, a visual exam of the cervix, and then an exam to evaluate the size and shape of the uterus and ovaries.  And after 79 years of age, a rectal exam is indicated to check for rectal cancers. We will also provide age appropriate advice regarding health maintenance, like when you should have certain vaccines, screening for sexually transmitted diseases, bone density testing, colonoscopy, mammograms, etc.   MAMMOGRAMS:  All women over 24 years old should have a yearly mammogram. Many facilities now offer a "3D" mammogram, which may cost around $50 extra out of pocket. If possible,  we recommend you accept the option to have the 3D mammogram performed.  It both reduces the number of women who will be called back for extra views which then turn out to be normal, and it is better than the routine mammogram at detecting truly abnormal areas.    COLON CANCER SCREENING: Now recommend starting at age 30. At this time colonoscopy is not covered for routine screening until 50. There are take home tests that can be done between 45-49.   COLONOSCOPY:  Colonoscopy to screen for colon cancer is recommended for all women at age 15.  We know, you hate the idea of the prep.  We agree, BUT, having colon cancer and not knowing it is worse!!  Colon cancer so often starts as a polyp that can be seen and removed at colonscopy, which can quite literally save your life!  And if your first colonoscopy is normal and you have no family history of colon cancer, most women don't have to have it again for 10 years.  Once every ten years, you can do something that may end up saving your life, right?  We will be happy to help you get it scheduled when you are ready.  Be sure to check your insurance coverage so you understand how much it will cost.  It may be covered as a preventative  service at no cost, but you should check your particular policy.      Breast Self-Awareness Breast self-awareness means being familiar with how your breasts look and feel. It involves checking your breasts regularly and reporting any changes to your health care provider. Practicing breast self-awareness is important. A change in your breasts can be a sign of a serious medical problem. Being familiar with how your breasts look and feel allows you to find any problems early, when treatment is more likely to be successful. All women should practice breast self-awareness, including women who have had breast implants. How to do a breast self-exam One way to learn what is normal for your breasts and whether your breasts are changing is to do a breast self-exam. To do a breast self-exam: Look for Changes  1. Remove all the clothing above your waist. 2. Stand in front of a mirror in a room with good lighting. 3. Put your hands on your hips. 4. Push your hands firmly downward. 5.  Compare your breasts in the mirror. Look for differences between them (asymmetry), such as: ? Differences in shape. ? Differences in size. ? Puckers, dips, and bumps in one breast and not the other. 6. Look at each breast for changes in your skin, such as: ? Redness. ? Scaly areas. 7. Look for changes in your nipples, such as: ? Discharge. ? Bleeding. ? Dimpling. ? Redness. ? A change in position. Feel for Changes Carefully feel your breasts for lumps and changes. It is best to do this while lying on your back on the floor and again while sitting or standing in the shower or tub with soapy water on your skin. Feel each breast in the following way:  Place the arm on the side of the breast you are examining above your head.  Feel your breast with the other hand.  Start in the nipple area and make  inch (2 cm) overlapping circles to feel your breast. Use the pads of your three middle fingers to do this. Apply light  pressure, then medium pressure, then firm pressure. The light pressure will allow you to feel the tissue closest to the skin. The medium pressure will allow you to feel the tissue that is a little deeper. The firm pressure will allow you to feel the tissue close to the ribs.  Continue the overlapping circles, moving downward over the breast until you feel your ribs below your breast.  Move one finger-width toward the center of the body. Continue to use the  inch (2 cm) overlapping circles to feel your breast as you move slowly up toward your collarbone.  Continue the up and down exam using all three pressures until you reach your armpit.  Write Down What You Find  Write down what is normal for each breast and any changes that you find. Keep a written record with breast changes or normal findings for each breast. By writing this information down, you do not need to depend only on memory for size, tenderness, or location. Write down where you are in your menstrual cycle, if you are still menstruating. If you are having trouble noticing differences in your breasts, do not get discouraged. With time you will become more familiar with the variations in your breasts and more comfortable with the exam. How often should I examine my breasts? Examine your breasts every month. If you are breastfeeding, the best time to examine your breasts is after a feeding or after using a breast pump. If you menstruate, the best time to examine your breasts is 5-7 days after your period is over. During your period, your breasts are lumpier, and it may be more difficult to notice changes. When should I see my health care provider? See your health care provider if you notice:  A change in shape or size of your breasts or nipples.  A change in the skin of your breast or nipples, such as a reddened or scaly area.  Unusual discharge from your nipples.  A lump or thick area that was not there before.  Pain in your  breasts.  Anything that concerns you.
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
## 2019-06-11 DIAGNOSIS — D2271 Melanocytic nevi of right lower limb, including hip: Secondary | ICD-10-CM | POA: Diagnosis not present

## 2019-06-11 DIAGNOSIS — L814 Other melanin hyperpigmentation: Secondary | ICD-10-CM | POA: Diagnosis not present

## 2019-06-11 DIAGNOSIS — L821 Other seborrheic keratosis: Secondary | ICD-10-CM | POA: Diagnosis not present

## 2019-06-11 DIAGNOSIS — D1801 Hemangioma of skin and subcutaneous tissue: Secondary | ICD-10-CM | POA: Diagnosis not present

## 2019-06-11 DIAGNOSIS — D692 Other nonthrombocytopenic purpura: Secondary | ICD-10-CM | POA: Diagnosis not present

## 2019-06-11 DIAGNOSIS — L820 Inflamed seborrheic keratosis: Secondary | ICD-10-CM | POA: Diagnosis not present

## 2019-06-11 DIAGNOSIS — B078 Other viral warts: Secondary | ICD-10-CM | POA: Diagnosis not present

## 2019-06-11 DIAGNOSIS — Z85828 Personal history of other malignant neoplasm of skin: Secondary | ICD-10-CM | POA: Diagnosis not present

## 2019-08-09 DIAGNOSIS — T161XXA Foreign body in right ear, initial encounter: Secondary | ICD-10-CM | POA: Diagnosis not present

## 2019-09-15 DIAGNOSIS — H90A31 Mixed conductive and sensorineural hearing loss, unilateral, right ear with restricted hearing on the contralateral side: Secondary | ICD-10-CM | POA: Diagnosis not present

## 2019-09-15 DIAGNOSIS — H90A22 Sensorineural hearing loss, unilateral, left ear, with restricted hearing on the contralateral side: Secondary | ICD-10-CM | POA: Diagnosis not present

## 2019-11-27 DIAGNOSIS — Z23 Encounter for immunization: Secondary | ICD-10-CM | POA: Diagnosis not present

## 2020-03-17 DIAGNOSIS — M859 Disorder of bone density and structure, unspecified: Secondary | ICD-10-CM | POA: Diagnosis not present

## 2020-03-17 DIAGNOSIS — Z0000 Encounter for general adult medical examination without abnormal findings: Secondary | ICD-10-CM | POA: Diagnosis not present

## 2020-03-17 DIAGNOSIS — E785 Hyperlipidemia, unspecified: Secondary | ICD-10-CM | POA: Diagnosis not present

## 2020-03-24 DIAGNOSIS — E785 Hyperlipidemia, unspecified: Secondary | ICD-10-CM | POA: Diagnosis not present

## 2020-03-24 DIAGNOSIS — R82998 Other abnormal findings in urine: Secondary | ICD-10-CM | POA: Diagnosis not present

## 2020-03-24 DIAGNOSIS — H9190 Unspecified hearing loss, unspecified ear: Secondary | ICD-10-CM | POA: Diagnosis not present

## 2020-03-24 DIAGNOSIS — M810 Age-related osteoporosis without current pathological fracture: Secondary | ICD-10-CM | POA: Diagnosis not present

## 2020-03-24 DIAGNOSIS — Z1339 Encounter for screening examination for other mental health and behavioral disorders: Secondary | ICD-10-CM | POA: Diagnosis not present

## 2020-03-24 DIAGNOSIS — Z1331 Encounter for screening for depression: Secondary | ICD-10-CM | POA: Diagnosis not present

## 2020-03-24 DIAGNOSIS — M6740 Ganglion, unspecified site: Secondary | ICD-10-CM | POA: Diagnosis not present

## 2020-03-24 DIAGNOSIS — R030 Elevated blood-pressure reading, without diagnosis of hypertension: Secondary | ICD-10-CM | POA: Diagnosis not present

## 2020-03-24 DIAGNOSIS — Q2733 Arteriovenous malformation of digestive system vessel: Secondary | ICD-10-CM | POA: Diagnosis not present

## 2020-03-24 DIAGNOSIS — M25552 Pain in left hip: Secondary | ICD-10-CM | POA: Diagnosis not present

## 2020-03-24 DIAGNOSIS — Z0000 Encounter for general adult medical examination without abnormal findings: Secondary | ICD-10-CM | POA: Diagnosis not present

## 2020-04-03 DIAGNOSIS — M810 Age-related osteoporosis without current pathological fracture: Secondary | ICD-10-CM | POA: Diagnosis not present

## 2020-05-11 ENCOUNTER — Other Ambulatory Visit: Payer: Self-pay | Admitting: Obstetrics and Gynecology

## 2020-05-11 DIAGNOSIS — Z1231 Encounter for screening mammogram for malignant neoplasm of breast: Secondary | ICD-10-CM

## 2020-05-30 DIAGNOSIS — R030 Elevated blood-pressure reading, without diagnosis of hypertension: Secondary | ICD-10-CM | POA: Diagnosis not present

## 2020-05-30 DIAGNOSIS — M810 Age-related osteoporosis without current pathological fracture: Secondary | ICD-10-CM | POA: Diagnosis not present

## 2020-06-15 DIAGNOSIS — D1801 Hemangioma of skin and subcutaneous tissue: Secondary | ICD-10-CM | POA: Diagnosis not present

## 2020-06-15 DIAGNOSIS — B078 Other viral warts: Secondary | ICD-10-CM | POA: Diagnosis not present

## 2020-06-15 DIAGNOSIS — L821 Other seborrheic keratosis: Secondary | ICD-10-CM | POA: Diagnosis not present

## 2020-06-15 DIAGNOSIS — L570 Actinic keratosis: Secondary | ICD-10-CM | POA: Diagnosis not present

## 2020-06-15 DIAGNOSIS — C44519 Basal cell carcinoma of skin of other part of trunk: Secondary | ICD-10-CM | POA: Diagnosis not present

## 2020-06-15 DIAGNOSIS — Z85828 Personal history of other malignant neoplasm of skin: Secondary | ICD-10-CM | POA: Diagnosis not present

## 2020-06-15 DIAGNOSIS — L814 Other melanin hyperpigmentation: Secondary | ICD-10-CM | POA: Diagnosis not present

## 2020-06-15 DIAGNOSIS — L720 Epidermal cyst: Secondary | ICD-10-CM | POA: Diagnosis not present

## 2020-06-15 DIAGNOSIS — D485 Neoplasm of uncertain behavior of skin: Secondary | ICD-10-CM | POA: Diagnosis not present

## 2020-07-04 ENCOUNTER — Other Ambulatory Visit: Payer: Self-pay

## 2020-07-04 ENCOUNTER — Ambulatory Visit
Admission: RE | Admit: 2020-07-04 | Discharge: 2020-07-04 | Disposition: A | Discharge status: Home / Self Care | Payer: PPO | Source: Ambulatory Visit | Attending: Obstetrics and Gynecology | Admitting: Obstetrics and Gynecology

## 2020-07-04 DIAGNOSIS — Z1231 Encounter for screening mammogram for malignant neoplasm of breast: Secondary | ICD-10-CM

## 2020-07-12 DIAGNOSIS — C44519 Basal cell carcinoma of skin of other part of trunk: Secondary | ICD-10-CM | POA: Diagnosis not present

## 2020-08-01 ENCOUNTER — Ambulatory Visit: Payer: PPO | Admitting: Obstetrics and Gynecology

## 2020-09-28 ENCOUNTER — Ambulatory Visit: Payer: PPO | Admitting: Obstetrics and Gynecology

## 2020-10-26 ENCOUNTER — Other Ambulatory Visit (HOSPITAL_COMMUNITY): Payer: Self-pay | Admitting: *Deleted

## 2020-10-27 ENCOUNTER — Other Ambulatory Visit: Payer: Self-pay

## 2020-10-27 ENCOUNTER — Ambulatory Visit (HOSPITAL_COMMUNITY)
Admission: RE | Admit: 2020-10-27 | Discharge: 2020-10-27 | Disposition: A | Discharge status: Home / Self Care | Payer: PPO | Source: Ambulatory Visit | Attending: Internal Medicine | Admitting: Internal Medicine

## 2020-10-27 DIAGNOSIS — M810 Age-related osteoporosis without current pathological fracture: Secondary | ICD-10-CM | POA: Diagnosis not present

## 2020-10-27 MED ORDER — DENOSUMAB 60 MG/ML ~~LOC~~ SOSY: 60.0000 mg | PREFILLED_SYRINGE | Freq: Once | SUBCUTANEOUS | Status: AC

## 2020-10-27 MED ORDER — DENOSUMAB 60 MG/ML ~~LOC~~ SOSY
PREFILLED_SYRINGE | SUBCUTANEOUS | Status: AC
  Administered 2020-10-27: 60 mg via SUBCUTANEOUS
  Filled 2020-10-27: qty 1

## 2020-10-30 ENCOUNTER — Encounter (HOSPITAL_COMMUNITY): Payer: PPO

## 2021-01-08 DIAGNOSIS — H6123 Impacted cerumen, bilateral: Secondary | ICD-10-CM | POA: Diagnosis not present

## 2021-01-08 DIAGNOSIS — H6121 Impacted cerumen, right ear: Secondary | ICD-10-CM | POA: Diagnosis not present

## 2021-01-08 DIAGNOSIS — H6122 Impacted cerumen, left ear: Secondary | ICD-10-CM | POA: Diagnosis not present

## 2021-03-28 ENCOUNTER — Other Ambulatory Visit (HOSPITAL_COMMUNITY): Payer: Self-pay | Admitting: *Deleted

## 2021-03-29 ENCOUNTER — Encounter (HOSPITAL_COMMUNITY)
Admission: RE | Admit: 2021-03-29 | Discharge: 2021-03-29 | Disposition: A | Discharge status: Home / Self Care | Payer: PPO | Source: Ambulatory Visit | Attending: Internal Medicine | Admitting: Internal Medicine

## 2021-03-29 DIAGNOSIS — M810 Age-related osteoporosis without current pathological fracture: Secondary | ICD-10-CM | POA: Insufficient documentation

## 2021-03-29 MED ORDER — DENOSUMAB 60 MG/ML ~~LOC~~ SOSY
PREFILLED_SYRINGE | SUBCUTANEOUS | Status: AC
  Filled 2021-03-29: qty 1

## 2021-03-29 MED ORDER — DENOSUMAB 60 MG/ML ~~LOC~~ SOSY
60.0000 mg | PREFILLED_SYRINGE | Freq: Once | SUBCUTANEOUS | Status: AC
  Administered 2021-03-29: 60 mg via SUBCUTANEOUS

## 2021-03-29 NOTE — Progress Notes (Signed)
Spoke with Maudie Mercury , Nurse at office Re : labs little over 1 year (03/17/20). Per Dr .Jacalyn Lefevre - physiciamn on call for Dr .Brigitte Pulse it was fine to proceed today with prolia inj (labs have been stable).

## 2021-04-04 DIAGNOSIS — R030 Elevated blood-pressure reading, without diagnosis of hypertension: Secondary | ICD-10-CM | POA: Diagnosis not present

## 2021-04-04 DIAGNOSIS — E785 Hyperlipidemia, unspecified: Secondary | ICD-10-CM | POA: Diagnosis not present

## 2021-04-04 DIAGNOSIS — M810 Age-related osteoporosis without current pathological fracture: Secondary | ICD-10-CM | POA: Diagnosis not present

## 2021-04-13 DIAGNOSIS — R0609 Other forms of dyspnea: Secondary | ICD-10-CM | POA: Diagnosis not present

## 2021-04-13 DIAGNOSIS — Z1331 Encounter for screening for depression: Secondary | ICD-10-CM | POA: Diagnosis not present

## 2021-04-13 DIAGNOSIS — Z0000 Encounter for general adult medical examination without abnormal findings: Secondary | ICD-10-CM | POA: Diagnosis not present

## 2021-04-13 DIAGNOSIS — E785 Hyperlipidemia, unspecified: Secondary | ICD-10-CM | POA: Diagnosis not present

## 2021-04-13 DIAGNOSIS — N3281 Overactive bladder: Secondary | ICD-10-CM | POA: Diagnosis not present

## 2021-04-13 DIAGNOSIS — R030 Elevated blood-pressure reading, without diagnosis of hypertension: Secondary | ICD-10-CM | POA: Diagnosis not present

## 2021-04-13 DIAGNOSIS — Z1339 Encounter for screening examination for other mental health and behavioral disorders: Secondary | ICD-10-CM | POA: Diagnosis not present

## 2021-04-13 DIAGNOSIS — M810 Age-related osteoporosis without current pathological fracture: Secondary | ICD-10-CM | POA: Diagnosis not present

## 2021-04-13 DIAGNOSIS — Q2733 Arteriovenous malformation of digestive system vessel: Secondary | ICD-10-CM | POA: Diagnosis not present

## 2021-04-13 DIAGNOSIS — Z86010 Personal history of colonic polyps: Secondary | ICD-10-CM | POA: Diagnosis not present

## 2021-05-21 ENCOUNTER — Other Ambulatory Visit: Payer: Self-pay | Admitting: Internal Medicine

## 2021-05-21 ENCOUNTER — Other Ambulatory Visit: Payer: Self-pay | Admitting: Obstetrics and Gynecology

## 2021-05-21 DIAGNOSIS — Z1231 Encounter for screening mammogram for malignant neoplasm of breast: Secondary | ICD-10-CM

## 2021-06-20 DIAGNOSIS — D1801 Hemangioma of skin and subcutaneous tissue: Secondary | ICD-10-CM | POA: Diagnosis not present

## 2021-06-20 DIAGNOSIS — Z85828 Personal history of other malignant neoplasm of skin: Secondary | ICD-10-CM | POA: Diagnosis not present

## 2021-06-20 DIAGNOSIS — L821 Other seborrheic keratosis: Secondary | ICD-10-CM | POA: Diagnosis not present

## 2021-06-20 DIAGNOSIS — L814 Other melanin hyperpigmentation: Secondary | ICD-10-CM | POA: Diagnosis not present

## 2021-07-17 ENCOUNTER — Ambulatory Visit
Admission: RE | Admit: 2021-07-17 | Discharge: 2021-07-17 | Disposition: A | Discharge status: Home / Self Care | Payer: PPO | Source: Ambulatory Visit | Attending: Internal Medicine | Admitting: Internal Medicine

## 2021-07-17 DIAGNOSIS — Z1231 Encounter for screening mammogram for malignant neoplasm of breast: Secondary | ICD-10-CM

## 2021-09-26 ENCOUNTER — Other Ambulatory Visit (HOSPITAL_COMMUNITY): Payer: Self-pay | Admitting: *Deleted

## 2021-09-27 ENCOUNTER — Ambulatory Visit (HOSPITAL_COMMUNITY)
Admission: RE | Admit: 2021-09-27 | Discharge: 2021-09-27 | Disposition: A | Discharge status: Home / Self Care | Payer: PPO | Source: Ambulatory Visit | Attending: Internal Medicine | Admitting: Internal Medicine

## 2021-09-27 DIAGNOSIS — M810 Age-related osteoporosis without current pathological fracture: Secondary | ICD-10-CM | POA: Diagnosis not present

## 2021-09-27 MED ORDER — DENOSUMAB 60 MG/ML ~~LOC~~ SOSY
60.0000 mg | PREFILLED_SYRINGE | Freq: Once | SUBCUTANEOUS | Status: AC
  Administered 2021-09-27: 60 mg via SUBCUTANEOUS

## 2022-03-29 ENCOUNTER — Encounter (HOSPITAL_COMMUNITY): Payer: PPO

## 2022-04-05 ENCOUNTER — Other Ambulatory Visit (HOSPITAL_COMMUNITY): Payer: Self-pay

## 2022-04-08 ENCOUNTER — Ambulatory Visit (HOSPITAL_COMMUNITY)
Admission: RE | Admit: 2022-04-08 | Discharge: 2022-04-08 | Disposition: A | Discharge status: Home / Self Care | Payer: PPO | Source: Ambulatory Visit | Attending: Internal Medicine | Admitting: Internal Medicine

## 2022-04-08 DIAGNOSIS — M810 Age-related osteoporosis without current pathological fracture: Secondary | ICD-10-CM | POA: Diagnosis not present

## 2022-04-08 MED ORDER — DENOSUMAB 60 MG/ML ~~LOC~~ SOSY
PREFILLED_SYRINGE | SUBCUTANEOUS | Status: AC
  Filled 2022-04-08: qty 1

## 2022-04-08 MED ORDER — DENOSUMAB 60 MG/ML ~~LOC~~ SOSY
60.0000 mg | PREFILLED_SYRINGE | Freq: Once | SUBCUTANEOUS | Status: AC
  Administered 2022-04-08: 60 mg via SUBCUTANEOUS

## 2022-04-16 DIAGNOSIS — M810 Age-related osteoporosis without current pathological fracture: Secondary | ICD-10-CM | POA: Diagnosis not present

## 2022-04-16 DIAGNOSIS — R7989 Other specified abnormal findings of blood chemistry: Secondary | ICD-10-CM | POA: Diagnosis not present

## 2022-04-16 DIAGNOSIS — R030 Elevated blood-pressure reading, without diagnosis of hypertension: Secondary | ICD-10-CM | POA: Diagnosis not present

## 2022-04-16 DIAGNOSIS — E785 Hyperlipidemia, unspecified: Secondary | ICD-10-CM | POA: Diagnosis not present

## 2022-04-30 DIAGNOSIS — M810 Age-related osteoporosis without current pathological fracture: Secondary | ICD-10-CM | POA: Diagnosis not present

## 2022-04-30 DIAGNOSIS — Z1331 Encounter for screening for depression: Secondary | ICD-10-CM | POA: Diagnosis not present

## 2022-04-30 DIAGNOSIS — Z23 Encounter for immunization: Secondary | ICD-10-CM | POA: Diagnosis not present

## 2022-04-30 DIAGNOSIS — E785 Hyperlipidemia, unspecified: Secondary | ICD-10-CM | POA: Diagnosis not present

## 2022-04-30 DIAGNOSIS — Z1339 Encounter for screening examination for other mental health and behavioral disorders: Secondary | ICD-10-CM | POA: Diagnosis not present

## 2022-04-30 DIAGNOSIS — R82998 Other abnormal findings in urine: Secondary | ICD-10-CM | POA: Diagnosis not present

## 2022-04-30 DIAGNOSIS — R030 Elevated blood-pressure reading, without diagnosis of hypertension: Secondary | ICD-10-CM | POA: Diagnosis not present

## 2022-04-30 DIAGNOSIS — Z0000 Encounter for general adult medical examination without abnormal findings: Secondary | ICD-10-CM | POA: Diagnosis not present

## 2022-04-30 DIAGNOSIS — N3281 Overactive bladder: Secondary | ICD-10-CM | POA: Diagnosis not present

## 2022-04-30 DIAGNOSIS — Z86010 Personal history of colonic polyps: Secondary | ICD-10-CM | POA: Diagnosis not present

## 2022-04-30 DIAGNOSIS — Q2733 Arteriovenous malformation of digestive system vessel: Secondary | ICD-10-CM | POA: Diagnosis not present

## 2022-04-30 DIAGNOSIS — R0609 Other forms of dyspnea: Secondary | ICD-10-CM | POA: Diagnosis not present

## 2022-08-29 DIAGNOSIS — D1801 Hemangioma of skin and subcutaneous tissue: Secondary | ICD-10-CM | POA: Diagnosis not present

## 2022-08-29 DIAGNOSIS — D225 Melanocytic nevi of trunk: Secondary | ICD-10-CM | POA: Diagnosis not present

## 2022-08-29 DIAGNOSIS — Z85828 Personal history of other malignant neoplasm of skin: Secondary | ICD-10-CM | POA: Diagnosis not present

## 2022-08-29 DIAGNOSIS — L853 Xerosis cutis: Secondary | ICD-10-CM | POA: Diagnosis not present

## 2022-08-29 DIAGNOSIS — D2261 Melanocytic nevi of right upper limb, including shoulder: Secondary | ICD-10-CM | POA: Diagnosis not present

## 2022-08-29 DIAGNOSIS — L821 Other seborrheic keratosis: Secondary | ICD-10-CM | POA: Diagnosis not present

## 2022-08-29 DIAGNOSIS — D2271 Melanocytic nevi of right lower limb, including hip: Secondary | ICD-10-CM | POA: Diagnosis not present

## 2022-08-29 DIAGNOSIS — L814 Other melanin hyperpigmentation: Secondary | ICD-10-CM | POA: Diagnosis not present

## 2022-09-23 ENCOUNTER — Other Ambulatory Visit (HOSPITAL_COMMUNITY): Payer: Self-pay

## 2022-10-14 ENCOUNTER — Ambulatory Visit (HOSPITAL_COMMUNITY)
Admission: RE | Admit: 2022-10-14 | Discharge: 2022-10-14 | Disposition: A | Discharge status: Home / Self Care | Payer: PPO | Source: Ambulatory Visit | Attending: Internal Medicine | Admitting: Internal Medicine

## 2022-10-14 DIAGNOSIS — M810 Age-related osteoporosis without current pathological fracture: Secondary | ICD-10-CM | POA: Insufficient documentation

## 2022-10-14 MED ORDER — DENOSUMAB 60 MG/ML ~~LOC~~ SOSY
60.0000 mg | PREFILLED_SYRINGE | Freq: Once | SUBCUTANEOUS | Status: AC
  Administered 2022-10-14: 60 mg via SUBCUTANEOUS

## 2022-10-14 MED ORDER — DENOSUMAB 60 MG/ML ~~LOC~~ SOSY
PREFILLED_SYRINGE | SUBCUTANEOUS | Status: AC
  Filled 2022-10-14: qty 1

## 2022-11-08 ENCOUNTER — Other Ambulatory Visit: Payer: Self-pay | Admitting: Internal Medicine

## 2022-11-08 DIAGNOSIS — Z0000 Encounter for general adult medical examination without abnormal findings: Secondary | ICD-10-CM

## 2022-11-11 DIAGNOSIS — E785 Hyperlipidemia, unspecified: Secondary | ICD-10-CM | POA: Diagnosis not present

## 2022-11-11 DIAGNOSIS — R030 Elevated blood-pressure reading, without diagnosis of hypertension: Secondary | ICD-10-CM | POA: Diagnosis not present

## 2022-11-11 DIAGNOSIS — R4189 Other symptoms and signs involving cognitive functions and awareness: Secondary | ICD-10-CM | POA: Diagnosis not present

## 2022-11-12 DIAGNOSIS — H6123 Impacted cerumen, bilateral: Secondary | ICD-10-CM | POA: Diagnosis not present

## 2022-11-12 DIAGNOSIS — H903 Sensorineural hearing loss, bilateral: Secondary | ICD-10-CM | POA: Diagnosis not present

## 2022-11-29 ENCOUNTER — Ambulatory Visit
Admission: RE | Admit: 2022-11-29 | Discharge: 2022-11-29 | Disposition: A | Discharge status: Home / Self Care | Payer: PPO | Source: Ambulatory Visit | Attending: Internal Medicine | Admitting: Internal Medicine

## 2022-11-29 DIAGNOSIS — Z1231 Encounter for screening mammogram for malignant neoplasm of breast: Secondary | ICD-10-CM | POA: Diagnosis not present

## 2022-11-29 DIAGNOSIS — Z0000 Encounter for general adult medical examination without abnormal findings: Secondary | ICD-10-CM

## 2022-12-21 DIAGNOSIS — J029 Acute pharyngitis, unspecified: Secondary | ICD-10-CM | POA: Diagnosis not present

## 2023-01-09 IMAGING — MG MM DIGITAL SCREENING BILAT W/ TOMO AND CAD
8 series · 9 of 24 positions shown · non-contrast
Comparison: Previous exam(s).

CLINICAL DATA: Screening.

EXAM:
DIGITAL SCREENING BILATERAL MAMMOGRAM WITH TOMOSYNTHESIS AND CAD
TECHNIQUE: Bilateral screening digital craniocaudal and mediolateral oblique
mammograms were obtained. Bilateral screening digital breast
tomosynthesis was performed. The images were evaluated with
computer-aided detection.

[L CC synth-2D]
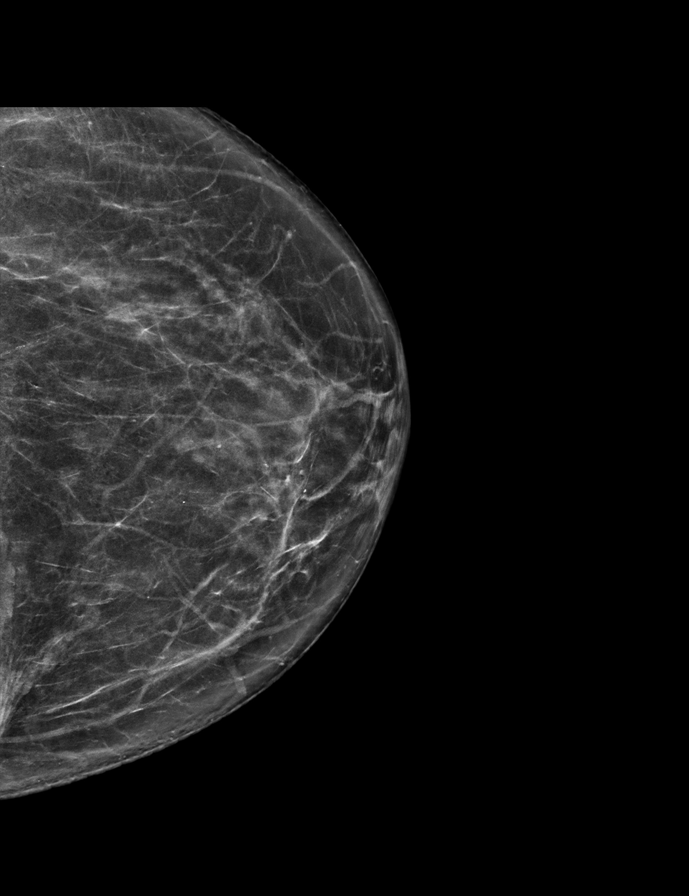

[L MLO synth-2D]
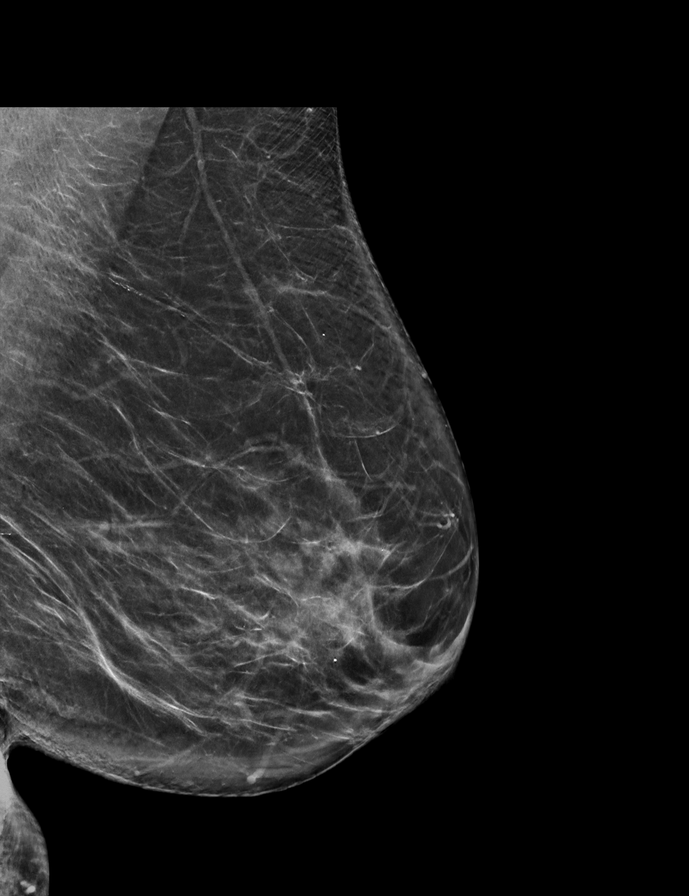

[R CC synth-2D]
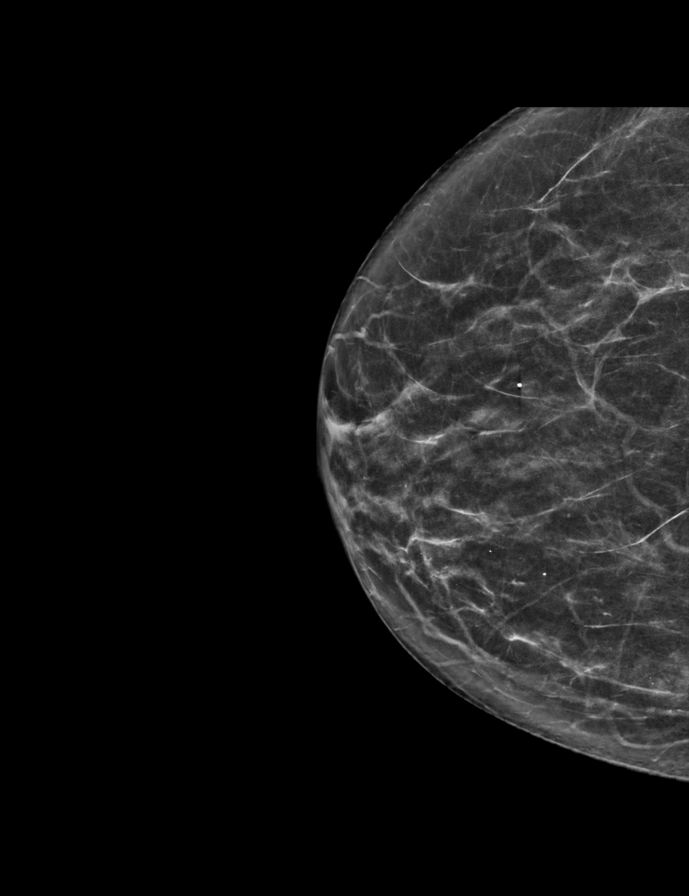

[R MLO synth-2D]
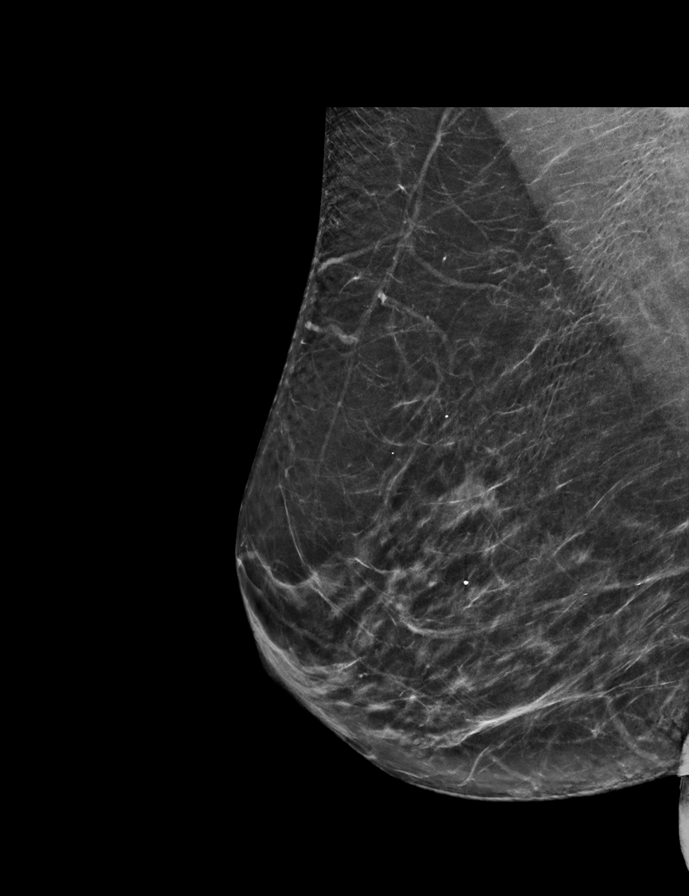

[R MLO tomo · 2 of 69 frames shown]
[frame 23/69]
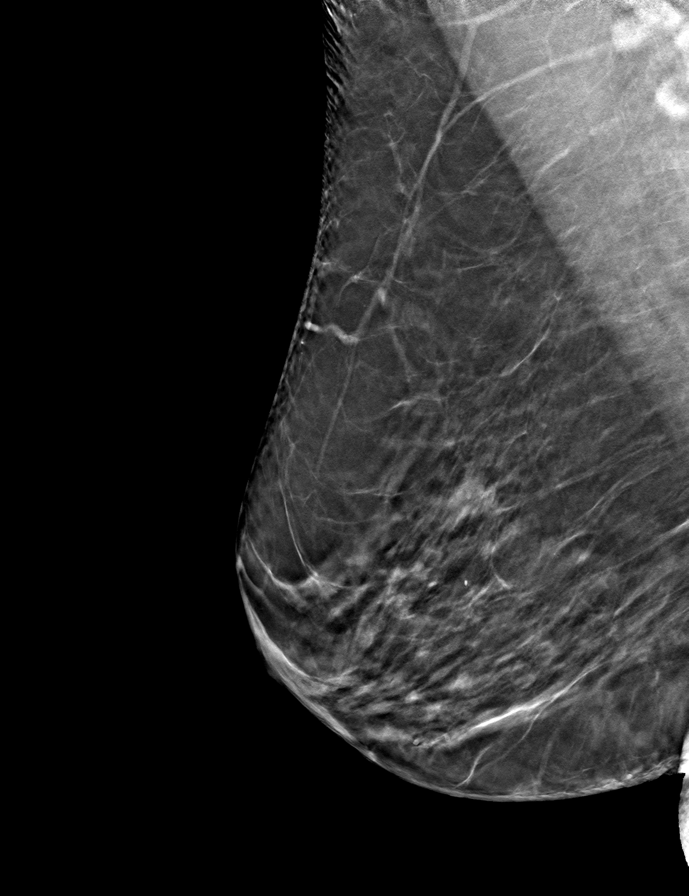
[frame 35/69]
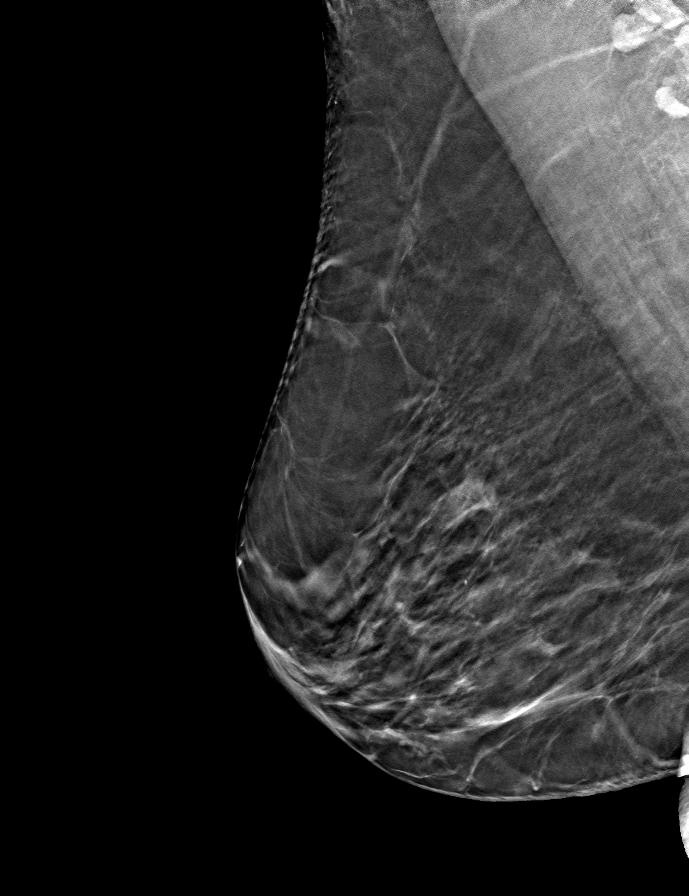

[L CC tomo · tomo slice 35/68.0]
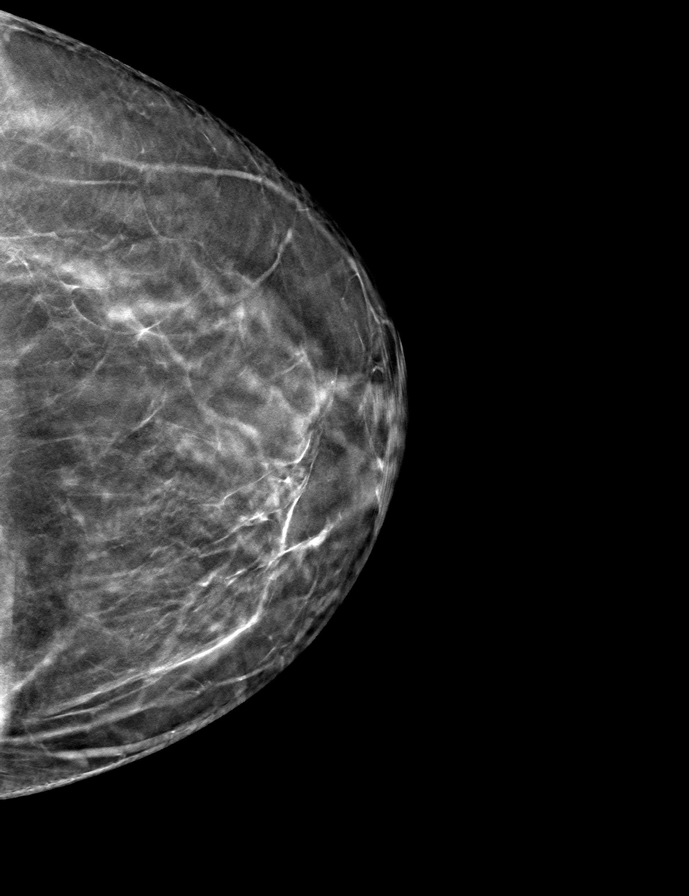

[L MLO tomo · tomo slice 40/79.0]
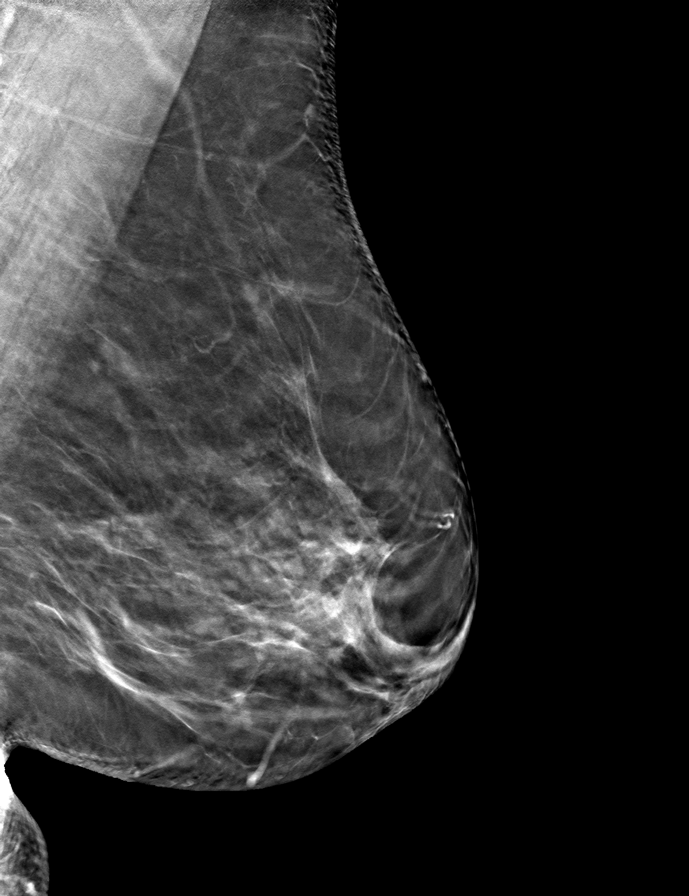

[R CC tomo · tomo slice 31/62.0]
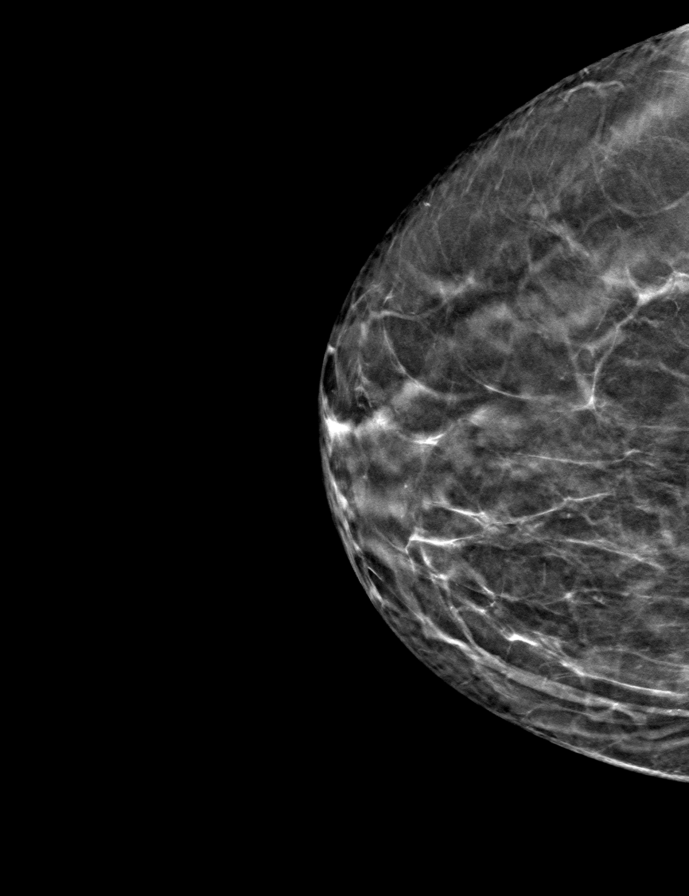

[9 of 24 positions shown; findings below may reference images not displayed]

ACR Breast Density Category b: There are scattered areas of
fibroglandular density.
FINDINGS: There are no findings suspicious for malignancy. The images were
evaluated with computer-aided detection.
IMPRESSION: No mammographic evidence of malignancy. A result letter of this
screening mammogram will be mailed directly to the patient.

RECOMMENDATION:
Screening mammogram in one year. (Code:WJ-[id number])

BI-RADS CATEGORY  1: Negative.

## 2023-04-16 ENCOUNTER — Other Ambulatory Visit (HOSPITAL_COMMUNITY): Payer: Self-pay | Admitting: *Deleted

## 2023-04-17 ENCOUNTER — Ambulatory Visit (HOSPITAL_COMMUNITY)
Admission: RE | Admit: 2023-04-17 | Discharge: 2023-04-17 | Disposition: A | Discharge status: Home / Self Care | Payer: PPO | Source: Ambulatory Visit | Attending: Internal Medicine | Admitting: Internal Medicine

## 2023-04-17 DIAGNOSIS — M810 Age-related osteoporosis without current pathological fracture: Secondary | ICD-10-CM | POA: Diagnosis not present

## 2023-04-17 MED ORDER — DENOSUMAB 60 MG/ML ~~LOC~~ SOSY
PREFILLED_SYRINGE | SUBCUTANEOUS | Status: AC
  Administered 2023-04-17: 60 mg via SUBCUTANEOUS
  Filled 2023-04-17: qty 1

## 2023-04-17 MED ORDER — DENOSUMAB 60 MG/ML ~~LOC~~ SOSY: 60.0000 mg | PREFILLED_SYRINGE | Freq: Once | SUBCUTANEOUS | Status: AC

## 2023-05-06 DIAGNOSIS — R030 Elevated blood-pressure reading, without diagnosis of hypertension: Secondary | ICD-10-CM | POA: Diagnosis not present

## 2023-05-06 DIAGNOSIS — M810 Age-related osteoporosis without current pathological fracture: Secondary | ICD-10-CM | POA: Diagnosis not present

## 2023-05-06 DIAGNOSIS — E785 Hyperlipidemia, unspecified: Secondary | ICD-10-CM | POA: Diagnosis not present

## 2023-05-13 DIAGNOSIS — E785 Hyperlipidemia, unspecified: Secondary | ICD-10-CM | POA: Diagnosis not present

## 2023-05-13 DIAGNOSIS — M810 Age-related osteoporosis without current pathological fracture: Secondary | ICD-10-CM | POA: Diagnosis not present

## 2023-05-13 DIAGNOSIS — N3281 Overactive bladder: Secondary | ICD-10-CM | POA: Diagnosis not present

## 2023-05-13 DIAGNOSIS — R030 Elevated blood-pressure reading, without diagnosis of hypertension: Secondary | ICD-10-CM | POA: Diagnosis not present

## 2023-05-13 DIAGNOSIS — Z1339 Encounter for screening examination for other mental health and behavioral disorders: Secondary | ICD-10-CM | POA: Diagnosis not present

## 2023-05-13 DIAGNOSIS — Z0000 Encounter for general adult medical examination without abnormal findings: Secondary | ICD-10-CM | POA: Diagnosis not present

## 2023-05-13 DIAGNOSIS — R82998 Other abnormal findings in urine: Secondary | ICD-10-CM | POA: Diagnosis not present

## 2023-05-13 DIAGNOSIS — Z1331 Encounter for screening for depression: Secondary | ICD-10-CM | POA: Diagnosis not present

## 2023-05-13 DIAGNOSIS — Z860101 Personal history of adenomatous and serrated colon polyps: Secondary | ICD-10-CM | POA: Diagnosis not present

## 2023-05-13 DIAGNOSIS — R2681 Unsteadiness on feet: Secondary | ICD-10-CM | POA: Diagnosis not present

## 2023-05-13 DIAGNOSIS — R4189 Other symptoms and signs involving cognitive functions and awareness: Secondary | ICD-10-CM | POA: Diagnosis not present

## 2023-08-19 ENCOUNTER — Other Ambulatory Visit (HOSPITAL_COMMUNITY): Payer: Self-pay | Admitting: Registered Nurse

## 2023-08-19 ENCOUNTER — Ambulatory Visit (HOSPITAL_COMMUNITY)
Admission: RE | Admit: 2023-08-19 | Discharge: 2023-08-19 | Disposition: A | Discharge status: Home / Self Care | Source: Ambulatory Visit | Attending: Vascular Surgery | Admitting: Vascular Surgery

## 2023-08-19 DIAGNOSIS — R600 Localized edema: Secondary | ICD-10-CM | POA: Diagnosis not present

## 2023-08-19 DIAGNOSIS — M7989 Other specified soft tissue disorders: Secondary | ICD-10-CM | POA: Diagnosis not present

## 2023-08-19 DIAGNOSIS — M79662 Pain in left lower leg: Secondary | ICD-10-CM | POA: Diagnosis not present

## 2023-09-18 ENCOUNTER — Other Ambulatory Visit: Payer: Self-pay | Admitting: Interventional Radiology

## 2023-09-18 DIAGNOSIS — M79662 Pain in left lower leg: Secondary | ICD-10-CM
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
## 2023-09-29 ENCOUNTER — Other Ambulatory Visit: Payer: Self-pay | Admitting: Interventional Radiology

## 2023-09-29 ENCOUNTER — Inpatient Hospital Stay
Admission: RE | Admit: 2023-09-29 | Discharge: 2023-09-29 | Disposition: A | Discharge status: Home / Self Care | Source: Ambulatory Visit | Attending: Interventional Radiology

## 2023-09-29 DIAGNOSIS — M79662 Pain in left lower leg: Secondary | ICD-10-CM

## 2023-09-29 DIAGNOSIS — I872 Venous insufficiency (chronic) (peripheral): Secondary | ICD-10-CM

## 2023-09-29 DIAGNOSIS — M7989 Other specified soft tissue disorders: Secondary | ICD-10-CM

## 2023-09-29 HISTORY — PX: IR RADIOLOGIST EVAL & MGMT: IMG5224

## 2023-10-02 DIAGNOSIS — Z85828 Personal history of other malignant neoplasm of skin: Secondary | ICD-10-CM | POA: Diagnosis not present

## 2023-10-02 DIAGNOSIS — L570 Actinic keratosis: Secondary | ICD-10-CM | POA: Diagnosis not present

## 2023-10-02 DIAGNOSIS — D2271 Melanocytic nevi of right lower limb, including hip: Secondary | ICD-10-CM | POA: Diagnosis not present

## 2023-10-02 DIAGNOSIS — D1801 Hemangioma of skin and subcutaneous tissue: Secondary | ICD-10-CM | POA: Diagnosis not present

## 2023-10-02 DIAGNOSIS — D225 Melanocytic nevi of trunk: Secondary | ICD-10-CM | POA: Diagnosis not present

## 2023-10-02 DIAGNOSIS — L821 Other seborrheic keratosis: Secondary | ICD-10-CM | POA: Diagnosis not present

## 2023-10-02 DIAGNOSIS — L814 Other melanin hyperpigmentation: Secondary | ICD-10-CM | POA: Diagnosis not present

## 2023-10-02 DIAGNOSIS — L820 Inflamed seborrheic keratosis: Secondary | ICD-10-CM | POA: Diagnosis not present
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
## 2023-10-03 ENCOUNTER — Ambulatory Visit
Admission: RE | Admit: 2023-10-03 | Discharge: 2023-10-03 | Disposition: A | Discharge status: Home / Self Care | Source: Ambulatory Visit | Attending: Interventional Radiology | Admitting: Interventional Radiology

## 2023-10-03 ENCOUNTER — Telehealth (HOSPITAL_COMMUNITY): Payer: Self-pay

## 2023-10-03 DIAGNOSIS — I872 Venous insufficiency (chronic) (peripheral): Secondary | ICD-10-CM

## 2023-10-03 DIAGNOSIS — R600 Localized edema: Secondary | ICD-10-CM | POA: Diagnosis not present

## 2023-10-03 NOTE — Telephone Encounter (Signed)
 Auth Submission: NO AUTH NEEDED Site of care: Site of care: [facility] Payer: [employer] Medication & CPT/J Code(s) submitted: Prolia  (Denosumab ) [id number] Diagnosis Code: M81.0 Route of submission (phone, fax, portal):  Phone # Fax # Auth type: Buy/Bill HB Units/visits requested: 60mg  x 2 doses q 6 months Reference number:  Approval from: 10/03/23 to 02/18/24

## 2023-10-15 ENCOUNTER — Other Ambulatory Visit (HOSPITAL_COMMUNITY): Payer: Self-pay | Admitting: *Deleted

## 2023-10-16 ENCOUNTER — Ambulatory Visit (HOSPITAL_COMMUNITY)
Admission: RE | Admit: 2023-10-16 | Discharge: 2023-10-16 | Disposition: A | Discharge status: Home / Self Care | Source: Ambulatory Visit | Attending: Internal Medicine | Admitting: Internal Medicine

## 2023-10-16 DIAGNOSIS — M810 Age-related osteoporosis without current pathological fracture: Secondary | ICD-10-CM | POA: Insufficient documentation

## 2023-10-16 MED ORDER — DENOSUMAB 60 MG/ML ~~LOC~~ SOSY
60.0000 mg | PREFILLED_SYRINGE | Freq: Once | SUBCUTANEOUS | Status: AC
  Administered 2023-10-16: 60 mg via SUBCUTANEOUS

## 2023-10-16 MED ORDER — DENOSUMAB 60 MG/ML ~~LOC~~ SOSY
PREFILLED_SYRINGE | SUBCUTANEOUS | Status: AC
  Filled 2023-10-16: qty 1

## 2023-11-12 DIAGNOSIS — H6123 Impacted cerumen, bilateral: Secondary | ICD-10-CM | POA: Diagnosis not present

## 2023-11-12 DIAGNOSIS — Z974 Presence of external hearing-aid: Secondary | ICD-10-CM | POA: Diagnosis not present

## 2024-01-05 ENCOUNTER — Other Ambulatory Visit: Payer: Self-pay | Admitting: Internal Medicine

## 2024-01-05 DIAGNOSIS — Z1231 Encounter for screening mammogram for malignant neoplasm of breast: Secondary | ICD-10-CM

## 2024-02-03 ENCOUNTER — Ambulatory Visit

## 2024-02-13 ENCOUNTER — Ambulatory Visit
Admission: RE | Admit: 2024-02-13 | Discharge: 2024-02-13 | Disposition: A | Discharge status: Home / Self Care | Source: Ambulatory Visit | Attending: Internal Medicine | Admitting: Internal Medicine

## 2024-02-13 DIAGNOSIS — Z1231 Encounter for screening mammogram for malignant neoplasm of breast: Secondary | ICD-10-CM

## 2024-03-17 ENCOUNTER — Other Ambulatory Visit: Payer: Self-pay

## 2024-03-26 LAB — SURGICAL PATHOLOGY
